# Patient Record
Sex: Female | Born: 1950 | Race: Black or African American | Hispanic: No | Marital: Married | Smoking: Former smoker
Health system: Southern US, Community
[De-identification: ages and names within clinical notes are randomized; demographics above are authoritative.]

## PROBLEM LIST (undated history)

## (undated) DIAGNOSIS — I10 Essential (primary) hypertension: Secondary | ICD-10-CM

## (undated) DIAGNOSIS — E039 Hypothyroidism, unspecified: Secondary | ICD-10-CM

## (undated) DIAGNOSIS — K219 Gastro-esophageal reflux disease without esophagitis: Secondary | ICD-10-CM

## (undated) DIAGNOSIS — E7800 Pure hypercholesterolemia, unspecified: Secondary | ICD-10-CM

## (undated) HISTORY — PX: THYROIDECTOMY: SHX17

## (undated) HISTORY — PX: CARDIAC CATHETERIZATION: SHX172

## (undated) HISTORY — PX: BILATERAL OOPHORECTOMY: SHX1221

## (undated) HISTORY — PX: ABDOMINAL HYSTERECTOMY: SHX81

---

## 2000-01-01 ENCOUNTER — Encounter: Payer: Self-pay | Admitting: Emergency Medicine

## 2000-01-01 ENCOUNTER — Inpatient Hospital Stay (HOSPITAL_COMMUNITY): Admission: EM | Admit: 2000-01-01 | Discharge: 2000-01-04 | Payer: Self-pay | Admitting: Emergency Medicine

## 2000-09-02 ENCOUNTER — Encounter: Payer: Self-pay | Admitting: Internal Medicine

## 2000-09-02 ENCOUNTER — Ambulatory Visit (HOSPITAL_COMMUNITY): Admission: RE | Admit: 2000-09-02 | Discharge: 2000-09-02 | Payer: Self-pay | Admitting: Internal Medicine

## 2000-12-21 ENCOUNTER — Ambulatory Visit (HOSPITAL_COMMUNITY): Admission: RE | Admit: 2000-12-21 | Discharge: 2000-12-21 | Payer: Self-pay | Admitting: General Surgery

## 2000-12-21 ENCOUNTER — Encounter (INDEPENDENT_AMBULATORY_CARE_PROVIDER_SITE_OTHER): Payer: Self-pay | Admitting: *Deleted

## 2000-12-21 ENCOUNTER — Encounter (HOSPITAL_BASED_OUTPATIENT_CLINIC_OR_DEPARTMENT_OTHER): Payer: Self-pay | Admitting: General Surgery

## 2001-01-21 ENCOUNTER — Encounter (HOSPITAL_BASED_OUTPATIENT_CLINIC_OR_DEPARTMENT_OTHER): Payer: Self-pay | Admitting: General Surgery

## 2001-01-25 ENCOUNTER — Ambulatory Visit (HOSPITAL_COMMUNITY): Admission: RE | Admit: 2001-01-25 | Discharge: 2001-01-26 | Payer: Self-pay | Admitting: General Surgery

## 2003-04-06 ENCOUNTER — Encounter: Payer: Self-pay | Admitting: Obstetrics

## 2003-04-06 ENCOUNTER — Encounter: Admission: RE | Admit: 2003-04-06 | Discharge: 2003-04-06 | Payer: Self-pay | Admitting: Obstetrics

## 2005-03-20 ENCOUNTER — Encounter: Admission: RE | Admit: 2005-03-20 | Discharge: 2005-03-20 | Payer: Self-pay | Admitting: Obstetrics

## 2005-07-28 ENCOUNTER — Encounter: Admission: RE | Admit: 2005-07-28 | Discharge: 2005-07-28 | Payer: Self-pay | Admitting: Cardiology

## 2008-07-03 ENCOUNTER — Encounter: Admission: RE | Admit: 2008-07-03 | Discharge: 2008-07-03 | Payer: Self-pay | Admitting: Obstetrics

## 2008-07-03 ENCOUNTER — Encounter: Admission: RE | Admit: 2008-07-03 | Discharge: 2008-07-03 | Payer: Self-pay | Admitting: Cardiology

## 2011-02-13 NOTE — Discharge Summary (Signed)
[place]. [facility]  Patient:    Brenda Rivera, Brenda Rivera                          MRN: [id number] Adm. Date:  [id number] Disc. Date: [id number] Attending:  Pola Corn Dictator:   Lyla Son. Achilles Dunk.                           Discharge Summary  DISCHARGE DIAGNOSES: 1. Non-Q wave myocardial infarction. 2. Hypothyroidism. 3. Esophageal reflux disease. 4. Tobacco disorder. 5. Anxiety state. 6. Angina.  CONSULTANT:  Mr. Rinaldo Cloud.  PROCEDURE:  Cardiac catheterization, 01/02/00.  HISTORY OF PRESENT ILLNESS:  This is a 61 year old female who presented initially to the emergency department with complaint of chest tightness.  The patient complained of retrosternal chest tightness and pressure which did not respond to Tagamet.  The pain gradually radiated under the left shoulder and was rated by he patient as an eight (on a scale of one to ten).  She received nitroglycerin with some relief.  Electrocardiogram showed normal sinus rhythm with poor R-wave progression in leads V1 through V3 and it was questioned as to whether or not this patient may have ad a non-Q wave MI.  The patient was subsequently admitted for further evaluation concerning this problem.  HOSPITAL COURSE:  The patient was placed on telemetry.  She was seen by pharmacy for heparin protocol.  She was treated with nitrates and was scheduled for cardiac catheterization.  On 01/02/00, cardiac catheterization revealed good systolic function.  The obtuse marginal #1 was a medium-sized vessel with myocardial infarction bridging in the midportion.  There were no significant lesions noted on cardiac catheterization.  DISPOSITION:  On 01/04/00, there were very few symptoms and it was the opinion hat the patient could be discharged home, having received maximum benefit from this  hospitalization.  DISCHARGE MEDICATIONS:  Toprol XL 25 mg one-half tablet daily, nitroglycerin patch 0.2  mg/hr, Xanax 0.5 mg b.i.d., aspirin one daily, Pepcid 20 mg twice daily and  Tiazac 120 mg daily.   DISCHARGE INSTRUCTIONS:  The patient is to follow-up with Dr. Sharyn Lull in one week, and with Dr. Shana Chute in two weeks.  She is to notify the physician immediately f any changes, problems or concerns. DD:  02/12/00 TD:  02/12/00 Job: [id number]

## 2011-02-13 NOTE — Op Note (Signed)
[place]. [facility]  Patient:    Brenda Rivera, Brenda Rivera                          MRN: [id number] Proc. Date: 01/25/01 Adm. Date:  [id number] Attending:  Sonda Primes CC:         Two copies to Dr. Lurene Shadow.  Lindell Spar. Chestine Spore, M.D.   Operative Report  PREOPERATIVE DIAGNOSIS:  Right thyroid goiter.  POSTOPERATIVE DIAGNOSIS:  Right thyroid goiter.  OPERATION:  Right thyroid lobectomy and isthmectomy with frozen section.  SURGEON:  Mardene Celeste. Lurene Shadow, M.D.  ASSISTANT:  Marnee Spring. Wiliam Ke, M.D.  ANESTHESIA:  General.  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  She had been on suppression therapy with Synthroid over a long period of time with good results; however, in the recent past, the thyroid has been enlarging rather rapidly.  She comes to the operating room now for thyroid lobectomy.  DESCRIPTION OF PROCEDURE:  Following the induction of satisfactory general anesthetic with the patient positioned supinely with the neck hyperextended, the anterior neck and chest were prepped and draped to be included in the sterile operative field.  A transverse collar incision made, deepened through the skin and subcutaneous tissue and carried down through the platysma. Superior flap raised up to the thyroid cartilage and the inferior flap down to the sternal notch.  Midline strap muscles were opened.  The midline was displaced to the left laterally due to the large thyroid nodule.  Strap muscles were opened and dissection carried down upon the thyroid.  Starting at the lower pole, the lower pole vessels were identified and transected and suture ligated with 2-0 silk sutures.  The lower pole parathyroid and the recurrent laryngeal nerve were both located and dissected free away from the thyroid.  Dissection was carried up along the thyroid up to the upper pole. The upper pole was doubly ligated with 0 silk sutures  and with a 2-0 stick tie.  The thyroid was then dissected free away from the trachea carrying the dissection across the isthmus to the left lobe.  The isthmus was transected and secured with ties of 2-0 silk.  All layers of dissection were then thoroughly checked for hemostasis.  Hemostasis was noted to be excellent. Sponge, instrument and sharp counts were verified and the neck was thoroughly irrigated.  Midline strap muscles were closed with a running suture of 3-0 Vicryl.  Platysma muscle and subcutaneous tissues were closed with interrupted 3-0 Vicryl sutures.  Skin was closed with a running 5-0 Monocryl and then reinforced with Steri-Strips.  Sterile dressing was applied.  Anesthetic was reversed.  The patient was removed from the operating room to the recovery room in stable condition.  She tolerated the procedure well. DD:  01/25/01 TD:  01/25/01 Job: [id number]

## 2011-02-13 NOTE — Cardiovascular Report (Signed)
[place]. [facility]  Patient:    Brenda Rivera, Brenda Rivera                          MRN: [id number] Proc. Date: 01/02/00 Adm. Date:  [id number] Attending:  Robynn Pane CC:         Osvaldo Shipper. Spruill, M.D.             Cardiac Catheterization Lab                        Cardiac Catheterization  PROCEDURES:  Left cardiac catheterization, selective left and right coronary angiography, left ventriculography via right groin using Judkins technique.  INDICATIONS FOR PROCEDURE:  Brenda Rivera is a 60 year old black female with a past medical history significant for hypothyroidism, GERD, tobacco abuse, anxiety disorder.  She came to the ER complaining of retrosternal chest tightness and pressure which started around 6:00 a.m.Marland Kitchen  She took Tagamet without relief.  She went to work and continued to have chest tightness and pressure radiating to under her left shoulder and finally came to the ER.  The pain was graded 6-8 out of 10, and she received two sublingual nitroglycerin with relief of chest tightness.  EKG done in the ER showed normal sinus rhythm with poor R wave progression, and V1-V3 had nonspecific T wave changes. CPK-MB was positive, and troponin-I was slightly elevated.  The patient presently is pain-free, denies PND, orthopnea, or leg swelling.  Denies palpitations, lightheadedness, or syncope, but felt dizzy and weak prior to coming to the hospital.  PAST MEDICAL HISTORY:  As above.  PAST SURGICAL HISTORY:  She had a D&C many years ago.  She had partial hysterectomy 16 years ago.  SOCIAL HISTORY:  She is married and has two children.  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  Born in [place], lives in [place].  FAMILY HISTORY:  Father died at the age of 53.  She does not know the cause. Mother had heart problems.  She is 69.  She has one brother and three sisters in good health.  PHYSICAL EXAMINATION:  On examination, she was  alert and oriented x 3 and in no acute distress.  Hemodynamically stable.  Sinus rhythm on monitor.  HEENT:  Conjunctiva was pink.  NECK:  Supple.  No JVD, no bruits.  LUNGS:  Clear to auscultation without rhonchi or rales.  CARDIOVASCULAR:  S1-S2 is normal.  There was no S3, gallop, or murmur.  ABDOMEN:  Soft.  Bowel sounds were present.  Nontender.  EXTREMITIES:  There was no clubbing, cyanosis, or edema.  LABORATORY:  CPK, first set, was 170 with an MB of 11.9 with ______  index of 7.0.  Second set CPK was 221, MB of 14.6 with ______ index of 6.6.  Her troponin, first set, was 0.12.  Second set was 0.332.  Third set was 0.54, so all troponins and CPKs were slightly elevated.  BRIEF HOSPITAL COURSE:  The patient was admitted to telemetry unit.  The patient ruled in for an MI due to positive troponins, CPKs and typical anginal chest pain.  Due to multiple risk factors and recent small non-Q-wave MI, the patient was advised for left catheterization and possible angioplasty to ______ .  PROCEDURE:  After obtaining the informed consent, the patient was brought to the catheterization lab and was placed on the fluoroscopy table.  The right  groin was prepped and draped in the usual fashion.  Xylocaine, 2%, was used for local anesthesia in the right groin.  With the help of a thin-walled needle, a 6-French arterial sheath was placed.  The sheath was aspirated and flushed.  Next, a 6-French left Judkins catheter was advanced over the wire under fluoroscopic guidance up to the ascending aorta.  The wire was pulled out.  The catheter was aspirated and connected to the manifold.  The catheter was further advanced and engaged into the left coronary ostium.  Multiple views of the left system were taken.  Next, the catheter was disengaged and was pulled out over the wire and was replaced with a 6-French right Judkins catheter which was advanced over the wire under fluoroscopic guidance up  to the ascending aorta.  The wire was pulled out.  The catheter was aspirated and connected to the manifold.  The catheter was further advanced and engaged into the right coronary ostium.  Multiple views of the right system were taken. Next, the catheter was disengaged and was pulled out over the wire and was replaced with a 6-French pigtail catheter which was advanced over the wire under fluoroscopic guidance up to the ascending aorta.  The wire was pulled out.  The catheter was aspirated and connected to the manifold.  The catheter was further advanced across the aortic valve into the LV.  LV pressures were recorded.  Next, a left ventriculogram was done in a 30-degree RAO position. Post angiographic pressures were recorded from the LV, and then pullback pressures were recorded from the aorta.  There was no gradient across the aortic valve.  Next, the pigtail catheter was pulled out over the wire.  The sheaths were aspirated and flushed.  The arteriotomy was closed with Perclose without any complications.  The patient tolerated the procedure well.  There were no complications.  FINDINGS:  LV:  The patient has good LV systolic function.  EF 50-55%.  1. The left main was patent. 2. The LAD was patent.  Diagonal #1 was medium sized with myocardial    bridging in the mid portion.  The vessel size is approximately 1.5 mm    and appears hazy focally in the mid portion during systole.  There    is no significant lesion in diastole and it was up appropriately in    diastole in the mid portion.  Diagonal #2 is very, very small.    Diagonal #3 and diagonal #4 are also very small, less than 1-mm size,    which are patent. 3. The left circumflex is small which tapers down in the AV groove.  The    OM-1 is medium sized which is patent. 4. The RCA has minimal plaquing distally.  The patient tolerated the procedure well.  PLAN:  Discontinue heparin.  Discontinue IV nitrates.  Change to  Nitro-Dur. Will continue with beta blockers due to myocardial bridging.  Will monitor  CPKs and troponin in a.m.  The patient will be discharged home per Dr. Shana Chute once she is stable, and her troponins and CPKs are negative. DD:  01/02/00 TD:  01/02/00 Job: [id number]

## 2012-02-15 ENCOUNTER — Other Ambulatory Visit: Payer: Self-pay | Admitting: Cardiology

## 2012-02-29 ENCOUNTER — Other Ambulatory Visit: Payer: Self-pay | Admitting: Cardiology

## 2012-05-12 ENCOUNTER — Other Ambulatory Visit: Payer: Self-pay | Admitting: Obstetrics

## 2012-05-12 DIAGNOSIS — Z1231 Encounter for screening mammogram for malignant neoplasm of breast: Secondary | ICD-10-CM

## 2012-05-17 ENCOUNTER — Ambulatory Visit
Admission: RE | Admit: 2012-05-17 | Discharge: 2012-05-17 | Disposition: A | Discharge status: Home / Self Care | Payer: 59 | Source: Ambulatory Visit | Attending: Obstetrics | Admitting: Obstetrics

## 2012-05-17 DIAGNOSIS — Z1231 Encounter for screening mammogram for malignant neoplasm of breast: Secondary | ICD-10-CM

## 2012-05-18 ENCOUNTER — Other Ambulatory Visit (HOSPITAL_COMMUNITY): Payer: Self-pay | Admitting: Obstetrics

## 2012-05-18 DIAGNOSIS — R1900 Intra-abdominal and pelvic swelling, mass and lump, unspecified site: Secondary | ICD-10-CM

## 2012-05-20 ENCOUNTER — Ambulatory Visit (HOSPITAL_COMMUNITY)
Admission: RE | Admit: 2012-05-20 | Discharge: 2012-05-20 | Disposition: A | Discharge status: Home / Self Care | Payer: 59 | Source: Ambulatory Visit | Attending: Obstetrics | Admitting: Obstetrics

## 2012-05-20 DIAGNOSIS — Z90710 Acquired absence of both cervix and uterus: Secondary | ICD-10-CM | POA: Insufficient documentation

## 2012-05-20 DIAGNOSIS — R1900 Intra-abdominal and pelvic swelling, mass and lump, unspecified site: Secondary | ICD-10-CM

## 2012-05-20 DIAGNOSIS — N9489 Other specified conditions associated with female genital organs and menstrual cycle: Secondary | ICD-10-CM | POA: Insufficient documentation

## 2012-05-24 ENCOUNTER — Ambulatory Visit (HOSPITAL_COMMUNITY)
Admission: RE | Admit: 2012-05-24 | Discharge: 2012-05-24 | Disposition: A | Discharge status: Home / Self Care | Payer: 59 | Source: Ambulatory Visit | Attending: Obstetrics | Admitting: Obstetrics

## 2012-05-24 ENCOUNTER — Other Ambulatory Visit (HOSPITAL_COMMUNITY): Payer: Self-pay | Admitting: Obstetrics

## 2012-05-24 DIAGNOSIS — R1909 Other intra-abdominal and pelvic swelling, mass and lump: Secondary | ICD-10-CM | POA: Insufficient documentation

## 2012-05-24 DIAGNOSIS — R1900 Intra-abdominal and pelvic swelling, mass and lump, unspecified site: Secondary | ICD-10-CM

## 2012-05-24 DIAGNOSIS — Z90710 Acquired absence of both cervix and uterus: Secondary | ICD-10-CM | POA: Insufficient documentation

## 2012-05-24 DIAGNOSIS — G589 Mononeuropathy, unspecified: Secondary | ICD-10-CM | POA: Insufficient documentation

## 2012-05-24 LAB — CREATININE, SERUM
Creatinine, Ser: 1.14 mg/dL — ABNORMAL HIGH (ref 0.50–1.10)
GFR calc Af Amer: 58 mL/min — ABNORMAL LOW (ref >90)
GFR calc non Af Amer: 50 mL/min — ABNORMAL LOW (ref >90)

## 2012-05-24 MED ORDER — GADOBENATE DIMEGLUMINE 529 MG/ML IV SOLN
14.0000 mL | Freq: Once | INTRAVENOUS | Status: AC | PRN
  Administered 2012-05-24: 14 mL via INTRAVENOUS

## 2012-10-19 ENCOUNTER — Other Ambulatory Visit (HOSPITAL_COMMUNITY): Payer: Self-pay | Admitting: Obstetrics

## 2012-10-19 DIAGNOSIS — R1900 Intra-abdominal and pelvic swelling, mass and lump, unspecified site: Secondary | ICD-10-CM

## 2012-10-20 ENCOUNTER — Ambulatory Visit (HOSPITAL_COMMUNITY)
Admission: RE | Admit: 2012-10-20 | Discharge: 2012-10-20 | Disposition: A | Discharge status: Home / Self Care | Payer: BC Managed Care – PPO | Source: Ambulatory Visit | Attending: Obstetrics | Admitting: Obstetrics

## 2012-10-20 DIAGNOSIS — R1900 Intra-abdominal and pelvic swelling, mass and lump, unspecified site: Secondary | ICD-10-CM | POA: Insufficient documentation

## 2012-10-20 DIAGNOSIS — G589 Mononeuropathy, unspecified: Secondary | ICD-10-CM | POA: Insufficient documentation

## 2012-10-20 LAB — CREATININE, SERUM
Creatinine, Ser: 0.95 mg/dL (ref 0.50–1.10)
GFR calc Af Amer: 72 mL/min — ABNORMAL LOW (ref >90)

## 2012-10-20 MED ORDER — GADOBENATE DIMEGLUMINE 529 MG/ML IV SOLN
14.0000 mL | Freq: Once | INTRAVENOUS | Status: AC | PRN
  Administered 2012-10-20: 14 mL via INTRAVENOUS

## 2012-11-10 ENCOUNTER — Ambulatory Visit: Payer: 59 | Admitting: Gynecologic Oncology

## 2012-12-01 ENCOUNTER — Telehealth: Payer: Self-pay | Admitting: Gynecologic Oncology

## 2012-12-01 ENCOUNTER — Ambulatory Visit: Payer: 59 | Admitting: Gynecologic Oncology

## 2012-12-01 NOTE — Telephone Encounter (Signed)
Dr. Elsie Stain office, referring physician, notified that the patient cancelled her new patient appt today with Dr. Nelly Rout.  Pt stated that she did not have insurance.

## 2013-02-17 ENCOUNTER — Telehealth: Payer: Self-pay | Admitting: Oncology

## 2013-02-17 NOTE — Telephone Encounter (Signed)
CALLED REFERRING OFFICE INQUIRING ABOUT REC'S PER OFFICE DR. WILL FAX OVER REC;S

## 2013-03-20 ENCOUNTER — Telehealth: Payer: Self-pay | Admitting: Gynecologic Oncology

## 2013-03-20 NOTE — Telephone Encounter (Signed)
Consult Note: Gyn-Onc  Consult was requested by Dr. Marland Kitchen for the evaluation of Brenda Rivera 62 y.o. female  CC:    Assessment/Plan:  Ms. JOSSLIN SANJUAN  is a 62 y.o.  year old **   HPI: **  Pelvic UTZ 04/2012  There is a cystic midline pelvic oval mass with homogeneous internal echoes measuring overall 12.9 x 10.8 x 7.5 cm.  Uterus and ovaries not visualized. Large midline cystic mass with imaging features suggestive of endometrioma in the appropriate clinical context. Otherwise, inclusion cyst, duplication cyst. lymphangioma, or less likely cystic neoplasm could have a similar appearance   Current Meds:  No outpatient encounter prescriptions on file as of 03/20/2013.   No facility-administered encounter medications on file as of 03/20/2013.    Allergy: Allergies not on file  Social Hx:   History   Social History  . Marital Status: Married    Spouse Name: N/A    Number of Children: N/A  . Years of Education: N/A   Occupational History  . Not on file.   Social History Main Topics  . Smoking status: Not on file  . Smokeless tobacco: Not on file  . Alcohol Use: Not on file  . Drug Use: Not on file  . Sexually Active: Not on file   Other Topics Concern  . Not on file   Social History Narrative  . No narrative on file    Past Surgical Hx: No past surgical history on file.  Past Medical Hx: No past medical history on file.  Past Gynecological History:  ** Hysterectomy   Family Hx: No family history on file.  Review of Systems:  Constitutional  Feels well,  ** Cardiovascular  No chest pain, shortness of breath, or edema  Pulmonary  No cough or wheeze.  Gastro Intestinal  No nausea, vomitting, or diarrhoea. No bright red blood per rectum, no abdominal pain, change in bowel movement, or constipation.  Genito Urinary  No frequency, urgency, dysuria, ** Musculo Skeletal  No myalgia, arthralgia, joint swelling or pain  Neurologic  No weakness, numbness, change  in gait,  Psychology  No depression, anxiety, insomnia.   Vitals:  @V @  Physical Exam: WD in NAD Neck  Supple NROM, without any enlargements.  Lymph Node Survey No cervical supraclavicular or inguinal adenopathy Cardiovascular  Pulse normal rate, regularity and rhythm. S1 and S2 normal.  Lungs  Clear to auscultation bilateraly, without wheezes/crackles/rhonchi. Good air movement.  Skin  No rash/lesions/breakdown  Psychiatry  Alert and oriented to person, place, and time  Abdomen  Normoactive bowel sounds, abdomen soft, non-tender and obese. Surgical  sites intact without evidence of hernia.  Back No CVA tenderness Genito Urinary  Vulva/vagina: Normal external female genitalia.  No lesions. No discharge or bleeding.  Bladder/urethra:  No lesions or masses  Vagina: **   Adnexa: No palpable masses. Rectal  Good tone, no masses no cul de sac nodularity.  Extremities  No bilateral cyanosis, clubbing or edema.   Laurette Schimke, MD, PhD 03/20/2013, 5:01 PM

## 2013-03-21 ENCOUNTER — Ambulatory Visit: Payer: PRIVATE HEALTH INSURANCE | Attending: Gynecologic Oncology | Admitting: Gynecologic Oncology

## 2013-03-21 ENCOUNTER — Encounter: Payer: Self-pay | Admitting: Gynecologic Oncology

## 2013-03-21 VITALS — BP 142/76 | HR 68 | Temp 98.0°F | Resp 16 | Ht 63.35 in | Wt 153.3 lb

## 2013-03-21 DIAGNOSIS — R1900 Intra-abdominal and pelvic swelling, mass and lump, unspecified site: Secondary | ICD-10-CM | POA: Insufficient documentation

## 2013-03-21 DIAGNOSIS — Z90710 Acquired absence of both cervix and uterus: Secondary | ICD-10-CM | POA: Insufficient documentation

## 2013-03-21 DIAGNOSIS — R1909 Other intra-abdominal and pelvic swelling, mass and lump: Secondary | ICD-10-CM | POA: Insufficient documentation

## 2013-03-21 NOTE — Progress Notes (Signed)
Consult Note: Gyn-Onc  Consult was requested by Dr. Gaynell Face for the evaluation of Brenda Rivera 62 y.o. female  CC: Pelvic mass   Assessment/Plan:  Brenda Rivera  is a 62 y.o.  year old with a 40 cm pelvic mass. CA 125 less than a year ago within normal limits. A CA 125 will be repeated today. The recommendation presented to her was that of exploratory laparotomy bilateral salpingo-oophorectomy with staging and debulking as indicated by a frozen pathology findings. Risks of procedure discussed with the patient her inclusive of infection bleeding damage to the structures a prolonged hospitalization. Patient is aware that her surgeon will be Dr. De Blanch of the procedure will take place on 04/18/2013. All of her questions were answered to her satisfaction   HPI: Brenda Rivera a 62 year old nulliparous female who is been aware of the presence of a pelvic mass over 1 year.  Patient has not been compliant with prior appointments because of insurance issues. Pelvic UTZ 04/2012  There is a cystic midline pelvic oval mass with homogeneous internal echoes measuring overall 12.9 x 10.8 x 7.5 cm. Uterus and ovaries not visualized. Large midline cystic mass with imaging features suggestive of endometrioma in the appropriate clinical context. Otherwise, inclusion cyst, duplication cyst. lymphangioma, or less likely cystic neoplasm could have a similar appearance Ca 125 04/2012 10.9.  Patient denies abdominal bloating, reports pressure.  No changes in bowel or bladder habits.  No nausea vomiting, no change in appetite no early satiety.   Current Meds:  Outpatient Encounter Prescriptions as of 03/21/2013  Medication Sig Dispense Refill  . aspirin 81 MG tablet Take 81 mg by mouth daily.      Marland Kitchen atorvastatin (LIPITOR) 20 MG tablet Take 20 mg by mouth daily.      . Cholecalciferol (VITAMIN D) 1000 UNITS capsule Take 1,000 Units by mouth daily.      Marland Kitchen diltiazem (TAZTIA XT) 120 MG 24 hr capsule Take 120 mg  by mouth daily.      . famotidine (PEPCID) 20 MG tablet Take 20 mg by mouth daily. One each morning before breakfast      . levothyroxine (SYNTHROID, LEVOTHROID) 50 MCG tablet Take 50 mcg by mouth daily before breakfast.       No facility-administered encounter medications on file as of 03/21/2013.    Allergy:  Allergies  Allergen Reactions  . Vibramycin (Doxycycline) Rash    Social Hx:   History   Social History  . Marital Status: Married    Spouse Name: N/A    Number of Children: N/A  . Years of Education: N/A   Occupational History  . Not on file.   Social History Main Topics  . Smoking status: Former Smoker    Quit date: 03/01/1989  . Smokeless tobacco: Not on file     Comment: pt unsure of exact quit date but it has been 53yrs since she quit.  . Alcohol Use: No  . Drug Use: No  . Sexually Active: No   Other Topics Concern  . Not on file   Social History Narrative  . No narrative on file    Past Surgical ZO:[id number]  Past Medical Hx: No past medical history on file.  Past Gynecological History:  G0 Menarche 12 regular menses until hysterectomy  for leiomyoma.  Family Hx: No family history on file.  Review of Systems:  Constitutional  Feels well,  Denies weight loss or malaise. Cardiovascular  No chest pain, shortness of  breath, or edema  Pulmonary  No cough or wheeze.  Gastro Intestinal  No nausea, vomitting, or diarrhoea. No bright red blood per rectum, no abdominal pain, change in bowel movement, or constipation. No abdominal bloating or early satiety Genito Urinary  No frequency, urgency, dysuria,no vaginal bleeding or discharge Musculo Skeletal  No myalgia, arthralgia, joint swelling or pain  Neurologic  No weakness, numbness, change in gait,  Psychology  No depression, anxiety, insomnia.   Vitals: BP 142/76  Pulse 68  Temp(Src) 98 F (36.7 C) (Oral)  Resp 16  Ht 5' 3.35" (1.609 m)  Wt 153 lb 4.8 oz (69.536 kg)  BMI 26.86  kg/m2  Physical Exam: WD in NAD Neck  Supple NROM, without any enlargements.  Lymph Node Survey No cervical supraclavicular or inguinal adenopathy Cardiovascular  Pulse normal rate, regularity and rhythm. S1 and S2 normal.  Lungs  Clear to auscultation bilateraly, without wheezes/crackles/rhonchi. Good air movement.  Psychiatry  Alert and oriented to person, place, and time  Abdomen  Normoactive bowel sounds, abdomen soft, non-tender and obese. Midline surgical  sites intact without evidence of hernia. Mass palpable Back No CVA tenderness Genito Urinary  Vulva/vagina: Normal external female genitalia.  No lesions. No discharge or bleeding.  Bladder/urethra:  No lesions or masses  Vagina:very atrophic, no discharge or bleeding  Adnexa: Firm 14cm pelvic mass.  No cul de sac nodularity  Rectal  Good tone, no masses no cul de sac nodularity. Mass palpable. Extremities  No bilateral cyanosis, clubbing or edema.   Laurette Schimke, MD, PhD 03/21/2013, 11:34 AM

## 2013-03-21 NOTE — Patient Instructions (Signed)
Exploratory laparotomy bilateral salpingo-oophorectomy and possible staging and debulking scheduled with Dr. De Blanch on 04/18/2013. Please present for your preoperative evaluation this is scheduled.   Thank you very much Brenda Rivera for allowing me to provide care for you today.  I appreciate your confidence in choosing our Gynecologic Oncology team.  If you have any questions about your visit today please call our office and we will get back to you as soon as possible.  [name]. Bretta Fees MD., PhD Gynecologic Oncology

## 2013-04-05 ENCOUNTER — Encounter (HOSPITAL_COMMUNITY): Payer: Self-pay | Admitting: Pharmacy Technician

## 2013-04-06 ENCOUNTER — Other Ambulatory Visit (HOSPITAL_COMMUNITY): Payer: Self-pay | Admitting: *Deleted

## 2013-04-07 ENCOUNTER — Encounter (HOSPITAL_COMMUNITY): Payer: Self-pay

## 2013-04-07 ENCOUNTER — Ambulatory Visit (HOSPITAL_COMMUNITY)
Admission: RE | Admit: 2013-04-07 | Discharge: 2013-04-07 | Disposition: A | Discharge status: Home / Self Care | Payer: PRIVATE HEALTH INSURANCE | Source: Ambulatory Visit | Attending: Gynecology | Admitting: Gynecology

## 2013-04-07 ENCOUNTER — Encounter (HOSPITAL_COMMUNITY)
Admission: RE | Admit: 2013-04-07 | Discharge: 2013-04-07 | Disposition: A | Discharge status: Home / Self Care | Payer: PRIVATE HEALTH INSURANCE | Source: Ambulatory Visit | Attending: Gynecology | Admitting: Gynecology

## 2013-04-07 DIAGNOSIS — Z01818 Encounter for other preprocedural examination: Secondary | ICD-10-CM | POA: Insufficient documentation

## 2013-04-07 DIAGNOSIS — R1900 Intra-abdominal and pelvic swelling, mass and lump, unspecified site: Secondary | ICD-10-CM | POA: Insufficient documentation

## 2013-04-07 HISTORY — DX: Pure hypercholesterolemia, unspecified: E78.00

## 2013-04-07 HISTORY — DX: Gastro-esophageal reflux disease without esophagitis: K21.9

## 2013-04-07 HISTORY — DX: Essential (primary) hypertension: I10

## 2013-04-07 HISTORY — DX: Hypothyroidism, unspecified: E03.9

## 2013-04-07 LAB — CBC WITH DIFFERENTIAL/PLATELET
Eosinophils Absolute: 0.1 10*3/uL (ref 0.0–0.7)
Eosinophils Relative: 2 % (ref 0–5)
HCT: 40.2 % (ref 36.0–46.0)
Hemoglobin: 13 g/dL (ref 12.0–15.0)
Lymphocytes Relative: 33 % (ref 12–46)
Lymphs Abs: 2 10*3/uL (ref 0.7–4.0)
MCH: 27 pg (ref 26.0–34.0)
MCV: 83.4 fL (ref 78.0–100.0)
Monocytes Relative: 7 % (ref 3–12)
RBC: 4.82 MIL/uL (ref 3.87–5.11)
WBC: 6.2 10*3/uL (ref 4.0–10.5)

## 2013-04-07 LAB — COMPREHENSIVE METABOLIC PANEL
ALT: 17 U/L (ref 0–35)
Alkaline Phosphatase: 100 U/L (ref 39–117)
BUN: 16 mg/dL (ref 6–23)
CO2: 30 mEq/L (ref 19–32)
Calcium: 10 mg/dL (ref 8.4–10.5)
GFR calc Af Amer: 71 mL/min — ABNORMAL LOW (ref >90)
GFR calc non Af Amer: 61 mL/min — ABNORMAL LOW (ref >90)
Glucose, Bld: 121 mg/dL — ABNORMAL HIGH (ref 70–99)
Total Protein: 7.8 g/dL (ref 6.0–8.3)

## 2013-04-07 NOTE — Progress Notes (Signed)
EKG 02/14/13 on chart

## 2013-04-07 NOTE — Patient Instructions (Addendum)
20 Brenda Rivera  04/07/2013   Your procedure is scheduled on: 04/18/13  Report to [facility] at 5:15 AM.  Call this number if you have problems the morning of surgery [phone]   Remember: follow bowel prep instuctions   Do not eat food or drink liquids After Midnight.     Take these medicines the morning of surgery with A SIP OF WATER: pepcid, diltiazem, levothyroxine, lipitor   Do not wear jewelry, make-up or nail polish.  Do not wear lotions, powders, or perfumes. You may wear deodorant.  Do not shave 48 hours prior to surgery. Men may shave face and neck.  Do not bring valuables to the hospital.  Contacts, dentures or bridgework may not be worn into surgery.  Leave suitcase in the car. After surgery it may be brought to your room.  For patients admitted to the hospital, checkout time is 11:00 AM the day of discharge.    Please read over the following fact sheets that you were given: MRSA Information, incentive spirometry fact sheet, clear liquids fact sheet  Birdie Sons, RN  pre op nurse call if needed [phone]    FAILURE TO FOLLOW THESE INSTRUCTIONS MAY RESULT IN CANCELLATION OF YOUR SURGERY   Patient Signature: ___________________________________________

## 2013-04-08 LAB — CA 125: CA 125: 7.3 U/mL (ref 0.0–30.2)

## 2013-04-17 ENCOUNTER — Telehealth: Payer: Self-pay | Admitting: Gynecologic Oncology

## 2013-04-17 NOTE — Telephone Encounter (Signed)
Spoke with patient about pre-operative status.  She has started taking in clear liquids only this am.  Plans to begin bowel prep in at 10 am today.  No concerns voiced.  Instructed to call for any needs.  Verbalizing understanding of instructions for surgery tomorrow morning.  

## 2013-04-17 NOTE — Anesthesia Preprocedure Evaluation (Addendum)
Anesthesia Evaluation  Patient identified by MRN, date of birth, ID band Patient awake    Reviewed: Allergy & Precautions, H&P , NPO status , Patient's Chart, lab work & pertinent test results  Airway Mallampati: II TM Distance: >3 FB Neck ROM: full    Dental  (+) Edentulous Upper, Missing and Dental Advisory Given Missing all but 2 teeth lower:   Pulmonary neg pulmonary ROS,  breath sounds clear to auscultation  Pulmonary exam normal       Cardiovascular Exercise Tolerance: Good hypertension, Pt. on medications Rhythm:regular Rate:Normal     Neuro/Psych negative neurological ROS  negative psych ROS   GI/Hepatic negative GI ROS, Neg liver ROS, GERD-  Medicated and Controlled,  Endo/Other  negative endocrine ROSHypothyroidism   Renal/GU negative Renal ROS  negative genitourinary   Musculoskeletal   Abdominal   Peds  Hematology negative hematology ROS (+)   Anesthesia Other Findings   Reproductive/Obstetrics negative OB ROS                          Anesthesia Physical Anesthesia Plan  ASA: III  Anesthesia Plan: General   Post-op Pain Management:    Induction: Intravenous  Airway Management Planned: Oral ETT  Additional Equipment:   Intra-op Plan:   Post-operative Plan: Extubation in OR  Informed Consent: I have reviewed the patients History and Physical, chart, labs and discussed the procedure including the risks, benefits and alternatives for the proposed anesthesia with the patient or authorized representative who has indicated his/her understanding and acceptance.   Dental Advisory Given  Plan Discussed with: CRNA and Surgeon  Anesthesia Plan Comments:         Anesthesia Quick Evaluation

## 2013-04-18 ENCOUNTER — Encounter (HOSPITAL_COMMUNITY)
Admission: RE | Disposition: A | Discharge status: Home / Self Care | Payer: Self-pay | Source: Ambulatory Visit | Attending: Obstetrics & Gynecology

## 2013-04-18 ENCOUNTER — Encounter (HOSPITAL_COMMUNITY): Payer: Self-pay | Admitting: Anesthesiology

## 2013-04-18 ENCOUNTER — Encounter (HOSPITAL_COMMUNITY): Payer: Self-pay | Admitting: *Deleted

## 2013-04-18 ENCOUNTER — Ambulatory Visit (HOSPITAL_COMMUNITY): Payer: No Typology Code available for payment source | Admitting: Anesthesiology

## 2013-04-18 ENCOUNTER — Inpatient Hospital Stay (HOSPITAL_COMMUNITY)
Admission: RE | Admit: 2013-04-18 | Discharge: 2013-04-21 | DRG: 742 | Disposition: A | Discharge status: Home / Self Care | Payer: No Typology Code available for payment source | Source: Ambulatory Visit | Attending: Obstetrics & Gynecology | Admitting: Obstetrics & Gynecology

## 2013-04-18 DIAGNOSIS — R1900 Intra-abdominal and pelvic swelling, mass and lump, unspecified site: Secondary | ICD-10-CM

## 2013-04-18 DIAGNOSIS — N83209 Unspecified ovarian cyst, unspecified side: Secondary | ICD-10-CM

## 2013-04-18 DIAGNOSIS — N135 Crossing vessel and stricture of ureter without hydronephrosis: Secondary | ICD-10-CM | POA: Diagnosis present

## 2013-04-18 DIAGNOSIS — D279 Benign neoplasm of unspecified ovary: Principal | ICD-10-CM | POA: Diagnosis present

## 2013-04-18 DIAGNOSIS — I10 Essential (primary) hypertension: Secondary | ICD-10-CM | POA: Diagnosis present

## 2013-04-18 HISTORY — PX: LAPAROTOMY: SHX154

## 2013-04-18 LAB — TYPE AND SCREEN

## 2013-04-18 SURGERY — LAPAROTOMY, EXPLORATORY
Anesthesia: General | Laterality: Bilateral | Wound class: Clean

## 2013-04-18 MED ORDER — NEOSTIGMINE METHYLSULFATE 1 MG/ML IJ SOLN
INTRAMUSCULAR | Status: DC | PRN
  Administered 2013-04-18: 5 mg via INTRAVENOUS

## 2013-04-18 MED ORDER — ONDANSETRON HCL 4 MG/2ML IJ SOLN: 4.0000 mg | Freq: Four times a day (QID) | INTRAMUSCULAR | Status: DC | PRN

## 2013-04-18 MED ORDER — KETOROLAC TROMETHAMINE 30 MG/ML IJ SOLN
15.0000 mg | Freq: Four times a day (QID) | INTRAMUSCULAR | Status: DC
  Administered 2013-04-18 – 2013-04-19 (×4): 15 mg via INTRAVENOUS
  Filled 2013-04-18 (×6): qty 1

## 2013-04-18 MED ORDER — KCL IN DEXTROSE-NACL 20-5-0.45 MEQ/L-%-% IV SOLN
INTRAVENOUS | Status: DC
  Administered 2013-04-18 – 2013-04-19 (×2): via INTRAVENOUS
  Filled 2013-04-18 (×2): qty 1000

## 2013-04-18 MED ORDER — LACTATED RINGERS IV SOLN: INTRAVENOUS | Status: DC

## 2013-04-18 MED ORDER — FAMOTIDINE 20 MG PO TABS
20.0000 mg | ORAL_TABLET | Freq: Every morning | ORAL | Status: DC
  Administered 2013-04-19 – 2013-04-21 (×3): 20 mg via ORAL
  Filled 2013-04-18 (×3): qty 1

## 2013-04-18 MED ORDER — FENTANYL CITRATE 0.05 MG/ML IJ SOLN
INTRAMUSCULAR | Status: DC | PRN
  Administered 2013-04-18 (×5): 50 ug via INTRAVENOUS

## 2013-04-18 MED ORDER — LACTATED RINGERS IV SOLN
INTRAVENOUS | Status: DC | PRN
  Administered 2013-04-18 (×2): via INTRAVENOUS

## 2013-04-18 MED ORDER — MAGNESIUM HYDROXIDE 400 MG/5ML PO SUSP
30.0000 mL | Freq: Three times a day (TID) | ORAL | Status: AC
  Administered 2013-04-18 – 2013-04-19 (×3): 30 mL via ORAL
  Filled 2013-04-18 (×3): qty 30

## 2013-04-18 MED ORDER — 0.9 % SODIUM CHLORIDE (POUR BTL) OPTIME
TOPICAL | Status: DC | PRN
  Administered 2013-04-18: 2000 mL

## 2013-04-18 MED ORDER — HEPARIN SODIUM (PORCINE) 1000 UNIT/ML IJ SOLN
INTRAMUSCULAR | Status: DC | PRN
  Administered 2013-04-18: 1000 [IU]

## 2013-04-18 MED ORDER — OXYCODONE HCL 5 MG PO TABS
5.0000 mg | ORAL_TABLET | ORAL | Status: DC | PRN
Start: 2013-04-18 — End: 2013-04-21

## 2013-04-18 MED ORDER — SUCCINYLCHOLINE CHLORIDE 20 MG/ML IJ SOLN
INTRAMUSCULAR | Status: DC | PRN
  Administered 2013-04-18: 100 mg via INTRAVENOUS

## 2013-04-18 MED ORDER — ATORVASTATIN CALCIUM 20 MG PO TABS
20.0000 mg | ORAL_TABLET | Freq: Every evening | ORAL | Status: DC
  Administered 2013-04-18 – 2013-04-20 (×3): 20 mg via ORAL
  Filled 2013-04-18 (×4): qty 1

## 2013-04-18 MED ORDER — HYDROMORPHONE HCL PF 1 MG/ML IJ SOLN
INTRAMUSCULAR | Status: AC
  Filled 2013-04-18: qty 1

## 2013-04-18 MED ORDER — LIDOCAINE HCL (CARDIAC) 20 MG/ML IV SOLN
INTRAVENOUS | Status: DC | PRN
  Administered 2013-04-18: 90 mg via INTRAVENOUS

## 2013-04-18 MED ORDER — HYDROMORPHONE HCL PF 1 MG/ML IJ SOLN
0.2500 mg | INTRAMUSCULAR | Status: DC | PRN
  Administered 2013-04-18 (×2): 0.5 mg via INTRAVENOUS

## 2013-04-18 MED ORDER — GLYCOPYRROLATE 0.2 MG/ML IJ SOLN
INTRAMUSCULAR | Status: DC | PRN
  Administered 2013-04-18: .6 mg via INTRAVENOUS

## 2013-04-18 MED ORDER — LEVOTHYROXINE SODIUM 50 MCG PO TABS
50.0000 ug | ORAL_TABLET | Freq: Every day | ORAL | Status: DC
  Filled 2013-04-18 (×3): qty 1

## 2013-04-18 MED ORDER — EPHEDRINE SULFATE 50 MG/ML IJ SOLN
INTRAMUSCULAR | Status: DC | PRN
  Administered 2013-04-18: 10 mg via INTRAVENOUS

## 2013-04-18 MED ORDER — KETOROLAC TROMETHAMINE 30 MG/ML IJ SOLN
15.0000 mg | Freq: Four times a day (QID) | INTRAMUSCULAR | Status: DC
  Filled 2013-04-18 (×2): qty 1

## 2013-04-18 MED ORDER — PROPOFOL 10 MG/ML IV BOLUS
INTRAVENOUS | Status: DC | PRN
  Administered 2013-04-18: 150 mg via INTRAVENOUS

## 2013-04-18 MED ORDER — DILTIAZEM HCL ER BEADS 120 MG PO CP24
120.0000 mg | ORAL_CAPSULE | Freq: Every day | ORAL | Status: DC
  Administered 2013-04-19 – 2013-04-21 (×3): 120 mg via ORAL
  Filled 2013-04-18 (×4): qty 1

## 2013-04-18 MED ORDER — SODIUM CHLORIDE 0.9 % IJ SOLN
INTRAMUSCULAR | Status: DC | PRN
  Administered 2013-04-18: 08:00:00

## 2013-04-18 MED ORDER — ONDANSETRON HCL 4 MG PO TABS: 4.0000 mg | ORAL_TABLET | Freq: Four times a day (QID) | ORAL | Status: DC | PRN

## 2013-04-18 MED ORDER — ACETAMINOPHEN 500 MG PO TABS
1000.0000 mg | ORAL_TABLET | Freq: Four times a day (QID) | ORAL | Status: DC
  Administered 2013-04-18 – 2013-04-19 (×4): 1000 mg via ORAL
  Filled 2013-04-18 (×8): qty 2

## 2013-04-18 MED ORDER — ENSURE COMPLETE PO LIQD: 237.0000 mL | Freq: Two times a day (BID) | ORAL | Status: DC

## 2013-04-18 MED ORDER — CEFAZOLIN SODIUM-DEXTROSE 2-3 GM-% IV SOLR
INTRAVENOUS | Status: AC
  Filled 2013-04-18: qty 50

## 2013-04-18 MED ORDER — CISATRACURIUM BESYLATE (PF) 10 MG/5ML IV SOLN
INTRAVENOUS | Status: DC | PRN
  Administered 2013-04-18: 10 mg via INTRAVENOUS

## 2013-04-18 MED ORDER — ONDANSETRON HCL 4 MG/2ML IJ SOLN
INTRAMUSCULAR | Status: DC | PRN
  Administered 2013-04-18: 4 mg via INTRAVENOUS

## 2013-04-18 MED ORDER — CEFAZOLIN SODIUM-DEXTROSE 2-3 GM-% IV SOLR
2.0000 g | INTRAVENOUS | Status: AC
  Administered 2013-04-18: 2 g via INTRAVENOUS

## 2013-04-18 MED ORDER — MIDAZOLAM HCL 5 MG/5ML IJ SOLN
INTRAMUSCULAR | Status: DC | PRN
  Administered 2013-04-18: 2 mg via INTRAVENOUS

## 2013-04-18 MED ORDER — ZOLPIDEM TARTRATE 5 MG PO TABS: 5.0000 mg | ORAL_TABLET | Freq: Every evening | ORAL | Status: DC | PRN

## 2013-04-18 MED ORDER — BUPIVACAINE LIPOSOME 1.3 % IJ SUSP
20.0000 mL | Freq: Once | INTRAMUSCULAR | Status: DC
  Filled 2013-04-18: qty 20

## 2013-04-18 MED ORDER — HEPARIN SODIUM (PORCINE) 1000 UNIT/ML IJ SOLN
INTRAMUSCULAR | Status: AC
  Filled 2013-04-18: qty 1

## 2013-04-18 MED ORDER — HYDROMORPHONE HCL PF 1 MG/ML IJ SOLN: 0.5000 mg | INTRAMUSCULAR | Status: DC | PRN

## 2013-04-18 MED ORDER — ASPIRIN EC 81 MG PO TBEC
81.0000 mg | DELAYED_RELEASE_TABLET | Freq: Every morning | ORAL | Status: DC
  Administered 2013-04-18 – 2013-04-21 (×4): 81 mg via ORAL
  Filled 2013-04-18 (×4): qty 1

## 2013-04-18 SURGICAL SUPPLY — 43 items
ATTRACTOMAT 16X20 MAGNETIC DRP (DRAPES) ×2 IMPLANT
BAG URINE DRAINAGE (UROLOGICAL SUPPLIES) ×2 IMPLANT
CANISTER SUCTION 2500CC (MISCELLANEOUS) ×2 IMPLANT
CHLORAPREP W/TINT 26ML (MISCELLANEOUS) ×2 IMPLANT
CLIP TI MEDIUM 6 (CLIP) ×1 IMPLANT
CLIP TI MEDIUM LARGE 6 (CLIP) IMPLANT
CLOTH BEACON ORANGE TIMEOUT ST (SAFETY) ×2 IMPLANT
COVER SURGICAL LIGHT HANDLE (MISCELLANEOUS) ×2 IMPLANT
DRAPE UTILITY XL STRL (DRAPES) ×2 IMPLANT
DRAPE WARM FLUID 44X44 (DRAPE) ×2 IMPLANT
DRSG TELFA 4X14 ISLAND ADH (GAUZE/BANDAGES/DRESSINGS) ×1 IMPLANT
ELECT BLADE 6.5 EXT (BLADE) ×2 IMPLANT
ELECT REM PT RETURN 9FT ADLT (ELECTROSURGICAL) ×2
ELECTRODE REM PT RTRN 9FT ADLT (ELECTROSURGICAL) ×1 IMPLANT
GAUZE SPONGE 4X4 16PLY XRAY LF (GAUZE/BANDAGES/DRESSINGS) ×1 IMPLANT
GLOVE BIOGEL M STRL SZ7.5 (GLOVE) ×10 IMPLANT
GOWN PREVENTION PLUS LG XLONG (DISPOSABLE) ×2 IMPLANT
GOWN STRL NON-REIN LRG LVL3 (GOWN DISPOSABLE) ×2 IMPLANT
GOWN STRL REIN XL XLG (GOWN DISPOSABLE) ×2 IMPLANT
LIGASURE IMPACT 36 18CM CVD LR (INSTRUMENTS) IMPLANT
NS IRRIG 1000ML POUR BTL (IV SOLUTION) ×6 IMPLANT
PACK ABDOMINAL WL (CUSTOM PROCEDURE TRAY) ×2 IMPLANT
SHEET LAVH (DRAPES) ×2 IMPLANT
SPONGE LAP 18X18 X RAY DECT (DISPOSABLE) ×2 IMPLANT
STAPLER SKIN PROX WIDE 3.9 (STAPLE) ×2 IMPLANT
STAPLER VISISTAT 35W (STAPLE) ×1 IMPLANT
SUT ETHILON 1 LR 30 (SUTURE) IMPLANT
SUT PDS AB 1 CTXB1 36 (SUTURE) ×4 IMPLANT
SUT SILK 2 0 (SUTURE)
SUT SILK 2 0 30  PSL (SUTURE)
SUT SILK 2 0 30 PSL (SUTURE) IMPLANT
SUT SILK 2-0 18XBRD TIE 12 (SUTURE) ×1 IMPLANT
SUT VIC AB 0 CT1 36 (SUTURE) ×4 IMPLANT
SUT VIC AB 2-0 CT2 27 (SUTURE) IMPLANT
SUT VIC AB 2-0 SH 27 (SUTURE) ×4
SUT VIC AB 2-0 SH 27X BRD (SUTURE) ×6 IMPLANT
SUT VIC AB 3-0 CTX 36 (SUTURE) IMPLANT
SUT VICRYL 2 0 18  UND BR (SUTURE) ×1
SUT VICRYL 2 0 18 UND BR (SUTURE) ×1 IMPLANT
TOWEL OR 17X26 10 PK STRL BLUE (TOWEL DISPOSABLE) ×2 IMPLANT
TOWEL OR NON WOVEN STRL DISP B (DISPOSABLE) ×2 IMPLANT
TRAY FOLEY CATH 14FRSI W/METER (CATHETERS) ×2 IMPLANT
WATER STERILE IRR 1500ML POUR (IV SOLUTION) ×1 IMPLANT

## 2013-04-18 NOTE — Transfer of Care (Signed)
Immediate Anesthesia Transfer of Care Note  Patient: Brenda Rivera  Procedure(s) Performed: Procedure(s): EXPLORATORY LAPAROTOMY BILATERAL SALPINGO OOPHORECTOMY  (Bilateral)  Patient Location: PACU  Anesthesia Type:General  Level of Consciousness: awake, alert  and oriented  Airway & Oxygen Therapy: Patient Spontanous Breathing and Patient connected to face mask oxygen  Post-op Assessment: Report given to PACU RN and Post -op Vital signs reviewed and stable  Post vital signs: Reviewed and stable  Complications: No apparent anesthesia complications

## 2013-04-18 NOTE — Op Note (Signed)
Brenda Rivera  female MEDICAL RECORD WG:[id number] DATE OF BIRTH: 1951/06/01 PHYSICIAN: De Blanch, M.D  04/18/2013   OPERATIVE REPORT  PREOPERATIVE DIAGNOSIS: Complex adnexal mass  POSTOPERATIVE DIAGNOSIS: Left ovarian serous cystadenoma  PROCEDURE: Bilateral salpingo-oophorectomy, left ureteral lysis.  SURGEON: De Blanch, M.D ASSISTANT: Antionette Char M.D. ANESTHESIA: Gen. with oral tracheal tube ESTIMATED BLOOD LOSS: 50 mL  SURGICAL FINDINGS: At exploratory laparotomy the left ovary was replaced by approximately 12 cm smooth-walled cystic mass. On frozen section this was found to be a serous cystadenoma. 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  PROCEDURE: Patient is brought to the operating room and after satisfactory attainment of general anesthesia was placed in a modified lithotomy position in [place] stirrups. The abdomen perineum and vagina were prepped, a Foley catheter was placed, the patient was draped. Surgical timeout was taken. Antibiotics were administered and SCDs were activated. The abdomen was entered through prior low midline incision excising the old scar. Upon entering the peritoneal cavity pelvic washings were obtained. The remainder the upper abdomen was explored and found to be normal. Bookwalter retractor was assembled. In order to gain adequate exposure the incision was extended around the umbilicus. The left retroperitoneal spaces opened identifying the vessels and ureter. The ovarian vessels were skeletonized clamped cut free tied and suture-ligated. In order to mobilize the ureter away from the site of the mass ureteral lysis was performed using sharp and blunt dissection and cautery for hemostasis. The round ligament was divided. The remainder of the pelvic sidewall peritoneum beneath the mass was incised. The mass is removed and sent to frozen section with the above-noted findings. Hemostasis achieved  with cautery.  The right side the pelvis was opened again identified vessels and ureter. The ovarian vessels were skeletonized clamped cut free tied and suture-ligated. The remainder the peritoneum beneath the right tube and ovary was incised in the right tube and ovary submitted to pathology.  The pelvis was irrigated and found to be hemostatic. The retractor and packs removed the anterior abdominal wall was closed in layers. The first being a running mass closure using #1 PDS. Subcutaneous tissue was irrigated and subcutaneous exparel was injected. Skin was closed skin staples a dressing was applied the patient was awakened from anesthesia and taken to the recovery room in satisfactory condition. Sponge needle and isthmic counts correct x2   De Blanch, M.D

## 2013-04-18 NOTE — Interval H&P Note (Signed)
History and Physical Interval Note:  04/18/2013 7:06 AM  Brenda Rivera  has presented today for surgery, with the diagnosis of PELVIC MASS  The various methods of treatment have been discussed with the patient and family. After consideration of risks, benefits and other options for treatment, the patient has consented to  Procedure(s): EXPLORATORY LAPAROTOMY BILATERAL SALPINGO OOPHORECTOMY WITH POSSIBLE STAGE AND DEBULKING  (Bilateral) as a surgical intervention .  The patient's history has been reviewed, patient examined, no change in status, stable for surgery.  I have reviewed the patient's chart and labs.  Questions were answered to the patient's satisfaction.     CLARKE-PEARSON,Kyanna Mahrt L

## 2013-04-18 NOTE — Anesthesia Postprocedure Evaluation (Signed)
  Anesthesia Post-op Note  Patient: Brenda Rivera  Procedure(s) Performed: Procedure(s) (LRB): EXPLORATORY LAPAROTOMY BILATERAL SALPINGO OOPHORECTOMY  (Bilateral)  Patient Location: PACU  Anesthesia Type: General  Level of Consciousness: awake and alert   Airway and Oxygen Therapy: Patient Spontanous Breathing  Post-op Pain: mild  Post-op Assessment: Post-op Vital signs reviewed, Patient's Cardiovascular Status Stable, Respiratory Function Stable, Patent Airway and No signs of Nausea or vomiting  Last Vitals:  Filed Vitals:   04/18/13 1010  BP: 146/81  Pulse: 61  Temp: 36.6 C  Resp: 12    Post-op Vital Signs: stable   Complications: No apparent anesthesia complications

## 2013-04-18 NOTE — Preoperative (Signed)
Beta Blockers   Reason not to administer Beta Blockers:Not Applicable 

## 2013-04-18 NOTE — H&P (View-Only) (Signed)
Consult Note: Gyn-Onc  Consult was requested by Dr. Gaynell Face for the evaluation of Brenda Rivera 62 y.o. female  CC: Pelvic mass   Assessment/Plan:  [name]  is a 62 y.o.  year old with a 40 cm pelvic mass. CA 125 less than a year ago within normal limits. A CA 125 will be repeated today. The recommendation presented to her was that of exploratory laparotomy bilateral salpingo-oophorectomy with staging and debulking as indicated by a frozen pathology findings. Risks of procedure discussed with the patient her inclusive of infection bleeding damage to the structures a prolonged hospitalization. Patient is aware that her surgeon will be Dr. De Blanch of the procedure will take place on 04/18/2013. All of her questions were answered to her satisfaction   HPI: Brenda Rivera a 62 year old nulliparous female who is been aware of the presence of a pelvic mass over 1 year.  Patient has not been compliant with prior appointments because of insurance issues. Pelvic UTZ 04/2012  There is a cystic midline pelvic oval mass with homogeneous internal echoes measuring overall 12.9 x 10.8 x 7.5 cm. Uterus and ovaries not visualized. Large midline cystic mass with imaging features suggestive of endometrioma in the appropriate clinical context. Otherwise, inclusion cyst, duplication cyst. lymphangioma, or less likely cystic neoplasm could have a similar appearance Ca 125 04/2012 10.9.  Patient denies abdominal bloating, reports pressure.  No changes in bowel or bladder habits.  No nausea vomiting, no change in appetite no early satiety.   Current Meds:  Outpatient Encounter Prescriptions as of 03/21/2013  Medication Sig Dispense Refill  . aspirin 81 MG tablet Take 81 mg by mouth daily.      Marland Kitchen atorvastatin (LIPITOR) 20 MG tablet Take 20 mg by mouth daily.      . Cholecalciferol (VITAMIN D) 1000 UNITS capsule Take 1,000 Units by mouth daily.      Marland Kitchen diltiazem (TAZTIA XT) 120 MG 24 hr capsule Take 120 mg  by mouth daily.      . famotidine (PEPCID) 20 MG tablet Take 20 mg by mouth daily. One each morning before breakfast      . levothyroxine (SYNTHROID, LEVOTHROID) 50 MCG tablet Take 50 mcg by mouth daily before breakfast.       No facility-administered encounter medications on file as of 03/21/2013.    Allergy:  Allergies  Allergen Reactions  . Vibramycin (Doxycycline) Rash    Social Hx:   History   Social History  . Marital Status: Married    Spouse Name: N/A    Number of Children: N/A  . Years of Education: N/A   Occupational History  . Not on file.   Social History Main Topics  . Smoking status: Former Smoker    Quit date: 03/01/1989  . Smokeless tobacco: Not on file     Comment: pt unsure of exact quit date but it has been 4yrs since she quit.  . Alcohol Use: No  . Drug Use: No  . Sexually Active: No   Other Topics Concern  . Not on file   Social History Narrative  . No narrative on file    Past Surgical LK:[id number]  Past Medical Hx: No past medical history on file.  Past Gynecological History:  G0 Menarche 12 regular menses until hysterectomy  for leiomyoma.  Family Hx: No family history on file.  Review of Systems:  Constitutional  Feels well,  Denies weight loss or malaise. Cardiovascular  No chest pain, shortness of  breath, or edema  Pulmonary  No cough or wheeze.  Gastro Intestinal  No nausea, vomitting, or diarrhoea. No bright red blood per rectum, no abdominal pain, change in bowel movement, or constipation. No abdominal bloating or early satiety Genito Urinary  No frequency, urgency, dysuria,no vaginal bleeding or discharge Musculo Skeletal  No myalgia, arthralgia, joint swelling or pain  Neurologic  No weakness, numbness, change in gait,  Psychology  No depression, anxiety, insomnia.   Vitals: BP 142/76  Pulse 68  Temp(Src) 98 F (36.7 C) (Oral)  Resp 16  Ht 5' 3.35" (1.609 m)  Wt 153 lb 4.8 oz (69.536 kg)  BMI 26.86  kg/m2  Physical Exam: WD in NAD Neck  Supple NROM, without any enlargements.  Lymph Node Survey No cervical supraclavicular or inguinal adenopathy Cardiovascular  Pulse normal rate, regularity and rhythm. S1 and S2 normal.  Lungs  Clear to auscultation bilateraly, without wheezes/crackles/rhonchi. Good air movement.  Psychiatry  Alert and oriented to person, place, and time  Abdomen  Normoactive bowel sounds, abdomen soft, non-tender and obese. Midline surgical  sites intact without evidence of hernia. Mass palpable Back No CVA tenderness Genito Urinary  Vulva/vagina: Normal external female genitalia.  No lesions. No discharge or bleeding.  Bladder/urethra:  No lesions or masses  Vagina:very atrophic, no discharge or bleeding  Adnexa: Firm 14cm pelvic mass.  No cul de sac nodularity  Rectal  Good tone, no masses no cul de sac nodularity. Mass palpable. Extremities  No bilateral cyanosis, clubbing or edema.   Laurette Schimke, MD, PhD 03/21/2013, 11:34 AM

## 2013-04-19 ENCOUNTER — Encounter (HOSPITAL_COMMUNITY): Payer: Self-pay | Admitting: Gynecology

## 2013-04-19 LAB — BASIC METABOLIC PANEL
BUN: 13 mg/dL (ref 6–23)
Calcium: 9.1 mg/dL (ref 8.4–10.5)
Creatinine, Ser: 1.13 mg/dL — ABNORMAL HIGH (ref 0.50–1.10)
GFR calc non Af Amer: 50 mL/min — ABNORMAL LOW (ref >90)
Glucose, Bld: 110 mg/dL — ABNORMAL HIGH (ref 70–99)
Potassium: 3.8 mEq/L (ref 3.5–5.1)

## 2013-04-19 LAB — CBC
Hemoglobin: 11.1 g/dL — ABNORMAL LOW (ref 12.0–15.0)
MCH: 26.7 pg (ref 26.0–34.0)
MCHC: 31.6 g/dL (ref 30.0–36.0)
RDW: 14.8 % (ref 11.5–15.5)

## 2013-04-19 MED ORDER — LEVOTHYROXINE SODIUM 50 MCG PO TABS
50.0000 ug | ORAL_TABLET | Freq: Every day | ORAL | Status: DC
  Administered 2013-04-19: 50 ug via ORAL
  Filled 2013-04-19 (×4): qty 1

## 2013-04-19 MED ORDER — IBUPROFEN 600 MG PO TABS
600.0000 mg | ORAL_TABLET | Freq: Four times a day (QID) | ORAL | Status: DC
  Administered 2013-04-19 – 2013-04-21 (×8): 600 mg via ORAL
  Filled 2013-04-19 (×11): qty 1

## 2013-04-19 NOTE — Care Management Note (Signed)
    Page 1 of 1   04/19/2013     11:27:15 AM   CARE MANAGEMENT NOTE 04/19/2013  Patient:  Brenda Rivera, Brenda Rivera   Account Number:  [id number]  Date Initiated:  04/19/2013  Documentation initiated by:  Lorenda Ishihara  Subjective/Objective Assessment:   62 yo female admitted s/p exploratory lap with BSO and ureteral lysis of adhesions.     Action/Plan:   Home when stable   Anticipated DC Date:  04/22/2013   Anticipated DC Plan:  HOME/SELF CARE      DC Planning Services  CM consult      Choice offered to / List presented to:             Status of service:  Completed, signed off Medicare Important Message given?   (If response is "NO", the following Medicare IM given date fields will be blank) Date Medicare IM given:   Date Additional Medicare IM given:    Discharge Disposition:  HOME/SELF CARE  Per UR Regulation:  Reviewed for med. necessity/level of care/duration of stay  If discussed at Long Length of Stay Meetings, dates discussed:    Comments:

## 2013-04-19 NOTE — Progress Notes (Signed)
1 Day Post-Op Procedure(s) (LRB): EXPLORATORY LAPAROTOMY BILATERAL SALPINGO OOPHORECTOMY  (Bilateral)  Subjective: Patient reports tolerating diet.  Ambulating with assistance.  Minimal pain reported.  No nausea or emesis reported.  Denies chest pain, dyspnea, passing flatus, or having a bowel movement.  No concerns voiced.    Objective: Vital signs in last 24 hours: Temp:  [97.1 F (36.2 C)-98.7 F (37.1 C)] 98.7 F (37.1 C) (07/23 0700) Pulse Rate:  [56-97] 73 (07/23 0700) Resp:  [11-23] 18 (07/23 0700) BP: (107-150)/(59-87) 133/84 mmHg (07/23 0700) SpO2:  [95 %-100 %] 95 % (07/23 0700) Weight:  [153 lb 6.4 oz (69.582 kg)] 153 lb 6.4 oz (69.582 kg) (07/22 1100) Last BM Date: 04/18/13  Intake/Output from previous day: 07/22 0701 - 07/23 0700 In: 3421.7 [P.O.:702; I.V.:2719.7] Out: 1650 [Urine:1650]  Physical Examination: General: alert, cooperative and no distress Resp: clear to auscultation bilaterally Cardio: regular rate and rhythm, S1, S2 normal, no murmur, click, rub or gallop GI: soft, non-tender; bowel sounds normal; no masses,  no organomegaly and incision: midline incision with staples open to air, clean, dry, and intact Extremities: extremities normal, atraumatic, no cyanosis or edema  Labs: WBC/Hgb/Hct/Plts:  7.9/11.1/35.1/187 (07/23 0355) BUN/Cr/glu/ALT/AST/amyl/lip:  13/1.13/--/--/--/--/-- (07/23 0355)  Assessment: 62 y.o. s/p Procedure(s): EXPLORATORY LAPAROTOMY BILATERAL SALPINGO OOPHORECTOMY : stable Pain:  Pain is well-controlled on PRN medications.  Heme:  Hgb 11.1 and Hct 35.1 this am.  Stable post-operatively.  CV: Hx Hypertension.  BP and HR stable post-operatively.  Current treatment:  diltiazem (Cardizem).  GI:  Tolerating po: Yes     GU:  Creatinine 1.13 this am.  Repeat labs in am.  FEN:  Stable post-operatively.    Prophylaxis: intermittent pneumatic compression boots.  Plan: Advance diet Encourage ambulation Advance to PO  medication Discontinue IV fluids Bmet in the am Encourage IS use, deep breathing, and coughing Continue post-operative plan of care   LOS: 1 day    CROSS, MELISSA DEAL 04/19/2013, 8:41 AM

## 2013-04-20 LAB — BASIC METABOLIC PANEL
Calcium: 8.8 mg/dL (ref 8.4–10.5)
GFR calc Af Amer: 64 mL/min — ABNORMAL LOW (ref >90)
GFR calc non Af Amer: 56 mL/min — ABNORMAL LOW (ref >90)
Glucose, Bld: 102 mg/dL — ABNORMAL HIGH (ref 70–99)
Sodium: 136 mEq/L (ref 135–145)

## 2013-04-20 MED ORDER — ACETAMINOPHEN 500 MG PO TABS
1000.0000 mg | ORAL_TABLET | Freq: Four times a day (QID) | ORAL | Status: DC
  Administered 2013-04-20 – 2013-04-21 (×4): 1000 mg via ORAL
  Filled 2013-04-20 (×4): qty 2

## 2013-04-20 MED ORDER — BISACODYL 10 MG RE SUPP
10.0000 mg | Freq: Once | RECTAL | Status: AC
  Administered 2013-04-20: 10 mg via RECTAL
  Filled 2013-04-20: qty 1

## 2013-04-20 NOTE — Progress Notes (Signed)
2 Days Post-Op Procedure(s) (LRB): EXPLORATORY LAPAROTOMY BILATERAL SALPINGO OOPHORECTOMY  (Bilateral)  Subjective: Patient reports doing well.  Tolerating diet.  Ambulating with assistance.  Minimal pain reported.  No nausea or emesis reported.  Denies chest pain, dyspnea, passing flatus, or having a bowel movement.  No concerns voiced.    Objective: Vital signs in last 24 hours: Temp:  [97.6 F (36.4 C)-98.1 F (36.7 C)] 98 F (36.7 C) (07/24 0616) Pulse Rate:  [65-82] 65 (07/24 0616) Resp:  [18] 18 (07/24 0616) BP: (110-136)/(65-83) 133/65 mmHg (07/24 0616) SpO2:  [93 %-99 %] 95 % (07/24 0616) Last BM Date: 04/18/13  Intake/Output from previous day: 07/23 0701 - 07/24 0700 In: 1417.5 [P.O.:1200; I.V.:217.5] Out: 3100 [Urine:3100]  Physical Examination: General: alert, cooperative and no distress Resp: clear to auscultation bilaterally Cardio: regular rate and rhythm, S1, S2 normal, no murmur, click, rub or gallop GI: incision: midline incision with staples open to air, clean, dry, and intact and abdomen mildly distended and tympanic, active bowel sounds noted, non-tender Extremities: extremities normal, atraumatic, no cyanosis or edema  Labs:   BUN/Cr/glu/ALT/AST/amyl/lip:  14/1.04/--/--/--/--/-- (07/24 0347)  Assessment: 62 y.o. s/p Procedure(s): EXPLORATORY LAPAROTOMY BILATERAL SALPINGO OOPHORECTOMY : stable Pain:  Pain is well-controlled on PRN medications.  Heme:  Hgb 11.1 and Hct 35.1 04/19/13.  Stable post-operatively.  CV: Hx Hypertension.  BP and HR stable post-operatively.  Current treatment:  diltiazem (Cardizem).  GI:  Tolerating po: Yes.     GU:  Creatinine 1.04 this am from 1.13.  FEN:  Stable post-operatively.    Prophylaxis: intermittent pneumatic compression boots.  Plan: Dulcolax suppository this am Encourage IS use, deep breathing, and coughing Continue post-operative plan of care   LOS: 2 days    CROSS, MELISSA DEAL 04/20/2013, 9:25  AM

## 2013-04-21 MED ORDER — OXYCODONE HCL 5 MG PO TABS: 5.0000 mg | ORAL_TABLET | ORAL | Status: DC | PRN

## 2013-04-21 NOTE — Discharge Summary (Signed)
Physician Discharge Summary  Patient ID: Brenda Rivera MRN: [id number] DOB/AGE: [birth year] 62 y.o.  Admit date: 04/18/2013 Discharge date: 04/21/2013  Admission Diagnoses: Pelvic mass in female  Discharge Diagnoses:  Principal Problem:   Pelvic mass in female  Discharged Condition:  The patient is in good condition and stable for discharge.    Hospital Course: On 04/18/2013, the patient underwent the following: Procedure(s):  EXPLORATORY LAPAROTOMY BILATERAL SALPINGO OOPHORECTOMY .  The postoperative course was uneventful.  She was discharged to home on postoperative day 3 tolerating a regular diet.  Consults: None  Significant Diagnostic Studies: None  Treatments: surgery: see above  Discharge Exam: Blood pressure 120/74, pulse 64, temperature 98.3 F (36.8 C), temperature source Oral, resp. rate 18, height 5\' 4"  (1.626 m), weight 153 lb 6.4 oz (69.582 kg), SpO2 95.00%. General appearance: alert, cooperative and no distress Resp: clear to auscultation bilaterally Cardio: regular rate and rhythm, S1, S2 normal, no murmur, click, rub or gallop GI: soft, non-tender; bowel sounds normal; no masses,  no organomegaly Extremities: extremities normal, atraumatic, no cyanosis or edema Incision/Wound: Midline incision with staples clean, dry, and intact  Disposition: Home  Discharge Orders   Future Appointments Provider Department Dept Phone   04/25/2013 11:45 AM Doylene Bode, NP [facility] [phone]   05/26/2013 10:45 AM Jeannette Corpus, MD [facility] [phone]   Future Orders Complete By Expires     Call MD for:  difficulty breathing, headache or visual disturbances  As directed     Call MD for:  extreme fatigue  As directed     Call MD for:  hives  As directed     Call MD for:  persistant dizziness or light-headedness  As directed     Call MD for:  persistant nausea and vomiting  As directed      Call MD for:  redness, tenderness, or signs of infection (pain, swelling, redness, odor or green/yellow discharge around incision site)  As directed     Call MD for:  severe uncontrolled pain  As directed     Call MD for:  temperature >100.4  As directed     Diet - low sodium heart healthy  As directed     Driving Restrictions  As directed     Comments:      No driving for 2 weeks.  Do not take narcotics and drive.    Increase activity slowly  As directed     Lifting restrictions  As directed     Comments:      No lifting greater than 10 lbs.    Sexual Activity Restrictions  As directed     Comments:      No sexual activity, nothing in the vagina, for 6 weeks.        Medication List         aspirin EC 81 MG tablet  Take 81 mg by mouth every morning.     atorvastatin 20 MG tablet  Commonly known as:  LIPITOR  Take 20 mg by mouth daily.     famotidine 20 MG tablet  Commonly known as:  PEPCID  Take 20 mg by mouth every morning.     levothyroxine 50 MCG tablet  Commonly known as:  SYNTHROID, LEVOTHROID  Take 50 mcg by mouth daily before breakfast.     oxyCODONE 5 MG immediate release tablet  Commonly known as:  Oxy IR/ROXICODONE  Take 1 tablet (  5 mg total) by mouth every 4 (four) hours as needed.     TAZTIA XT 120 MG 24 hr capsule  Generic drug:  diltiazem  Take 120 mg by mouth daily.     Vitamin D 1000 UNITS capsule  Take 1,000 Units by mouth daily.           Follow-up Information   Follow up with Erika Slaby DEAL, NP On 04/25/2013. (for staple removal at the [facility])    Contact information:   [address] [place] [address] [phone]       Follow up with Jeannette Corpus, MD On 05/26/2013. (at the [facility] for post-op evaluation)    Contact information:   [address] [place] [address] [phone]       Greater than thirty minutes were spend for face to face discharge instructions and discharge orders/summary  in EPIC.   Signed: Jaymee Tilson DEAL 04/21/2013, 9:16 AM

## 2013-04-25 ENCOUNTER — Ambulatory Visit: Payer: No Typology Code available for payment source | Attending: Gynecologic Oncology | Admitting: Gynecologic Oncology

## 2013-04-25 ENCOUNTER — Encounter: Payer: Self-pay | Admitting: Gynecologic Oncology

## 2013-04-25 VITALS — BP 126/74 | HR 68 | Temp 98.6°F | Resp 16 | Ht 63.35 in | Wt 153.5 lb

## 2013-04-25 DIAGNOSIS — R1900 Intra-abdominal and pelvic swelling, mass and lump, unspecified site: Secondary | ICD-10-CM

## 2013-04-25 NOTE — Patient Instructions (Signed)
Plan to follow up as scheduled.  Please call for any questions or concerns.

## 2013-04-25 NOTE — Progress Notes (Signed)
Follow Up Note: Gyn-Onc  Brenda Rivera 62 y.o. female  CC:  Chief Complaint  Patient presents with  . Post-op follow up, staple removal    Follow up    HPI:  Brenda Rivera a 62 year old nulliparous female who is been aware of the presence of a pelvic mass over 1 year. Patient has not been compliant with prior appointments because of insurance issues.  Pelvic UTZ on 04/2012 resulted:  There is a cystic midline pelvic oval mass with homogeneous internal echoes measuring overall 12.9 x 10.8 x 7.5 cm. Uterus and ovaries not visualized. Large midline cystic mass with imaging features suggestive of endometrioma in the appropriate clinical context. Otherwise, inclusion cyst, duplication cyst. lymphangioma, or less likely cystic neoplasm could have a similar appearance.  Ca 125 on 04/2012 10.9.  On 04/18/13, she underwent a bilateral salpingo-oophorectomy and left ureteral lysis by Dr. Stanford Breed.  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  Her post-operative course was uneventful.      Interval History:  She presents today alone for post-operative follow up and staple removal.  She describes expected post operative status.  Adequate PO intake reported.  Bowels and bladder functioning without difficulty.  Pain minimal with the use of ibuprofen for pain.  No concerns voiced.    Review of Systems  Constitutional: Feels well.  No fever, chills, early satiety, change in appetite, or change in weight. Cardiovascular: No chest pain, shortness of breath, or edema.  Pulmonary: No cough or wheeze.  Gastrointestinal: No nausea, vomiting, or diarrhea. No bright red blood per rectum or change in bowel movement.  Genitourinary: No frequency, urgency, or dysuria. No vaginal bleeding or discharge.  Musculoskeletal: No myalgia or joint pain. Neurologic: No weakness, numbness, or change in gait.  Psychology: No depression, anxiety, or insomnia.  Current Meds:  Outpatient  Encounter Prescriptions as of 04/25/2013  Medication Sig Dispense Refill  . aspirin EC 81 MG tablet Take 81 mg by mouth every morning.      Marland Kitchen atorvastatin (LIPITOR) 20 MG tablet Take 20 mg by mouth daily.      . Cholecalciferol (VITAMIN D) 1000 UNITS capsule Take 1,000 Units by mouth daily.      Marland Kitchen diltiazem (TAZTIA XT) 120 MG 24 hr capsule Take 120 mg by mouth daily.      . famotidine (PEPCID) 20 MG tablet Take 20 mg by mouth every morning.       Marland Kitchen levothyroxine (SYNTHROID, LEVOTHROID) 50 MCG tablet Take 50 mcg by mouth daily before breakfast.      . oxyCODONE (OXY IR/ROXICODONE) 5 MG immediate release tablet Take 1 tablet (5 mg total) by mouth every 4 (four) hours as needed.  10 tablet  0   No facility-administered encounter medications on file as of 04/25/2013.    Allergy:  Allergies  Allergen Reactions  . Vibramycin (Doxycycline) Rash    Social Hx:   History   Social History  . Marital Status: Married    Spouse Name: N/A    Number of Children: N/A  . Years of Education: N/A   Occupational History  . Not on file.   Social History Main Topics  . Smoking status: Former Smoker -- 20 years    Types: Cigarettes    Quit date: 03/01/1989  . Smokeless tobacco: Never Used     Comment: pt unsure of exact quit date but it has been 46yrs since she quit.  . Alcohol Use: No  . Drug  Use: No  . Sexually Active: No   Other Topics Concern  . Not on file   Social History Narrative  . No narrative on file    Past Surgical Hx:  Past Surgical History  Procedure Laterality Date  . Thyroidectomy  over 10  years ago  . Abdominal hysterectomy  30 years ago    partial  . Cardiac catheterization    . Laparotomy Bilateral 04/18/2013    Procedure: EXPLORATORY LAPAROTOMY BILATERAL SALPINGO OOPHORECTOMY ;  Surgeon: Jeannette Corpus, MD;  Location: [facility];  Service: Gynecology;  Laterality: Bilateral;    Past Medical Hx:  Past Medical History  Diagnosis Date  . Hypertension   .  Hypercholesteremia   . Hypothyroidism   . GERD (gastroesophageal reflux disease)     Family Hx: History reviewed. No pertinent family history.  Vitals:  Blood pressure 126/74, pulse 68, temperature 98.6 F (37 C), resp. rate 16, height 5' 3.35" (1.609 m), weight 153 lb 8 oz (69.627 kg).  Physical Exam:  General: Well developed, well nourished female in no acute distress. Alert and oriented x 3.  Neck: Supple without any enlargements.  Lymph node survey: No cervical, supraclavicular, or inguinal adenopathy  Cardiovascular: Regular rate and rhythm. S1 and S2 normal.  Lungs: Clear to auscultation bilaterally. No wheezes/crackles/rhonchi noted.  Skin: No rashes or lesions present. Back: No CVA tenderness.  Abdomen: Abdomen soft, non-tender and non-obese. Active bowel sounds in all quadrants.  20 staples removed from the midline incision without difficulty.  Steri-strips applied.  No erythema or drainage present.  Incision clean, dry, and intact.  Extremities: No bilateral cyanosis, edema, or clubbing.   Assessment/Plan:  62 year old s/p bilateral salpingo-oophorectomy with left ureteral lysis by Dr. Stanford Breed on 04/18/13.  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  She is doing well post-operatively.  She is advised to keep her follow up appointment with Dr. Stanford Breed on 05/26/13 for post-operative follow up.  Incision care reinforced.  She is advised to call for any questions or concerns.  Reportable signs and symptoms reviewed.     CROSS, MELISSA DEAL, NP 04/25/2013, 2:32 PM

## 2013-05-26 ENCOUNTER — Encounter: Payer: Self-pay | Admitting: Gynecology

## 2013-05-26 ENCOUNTER — Ambulatory Visit: Payer: No Typology Code available for payment source | Attending: Gynecology | Admitting: Gynecology

## 2013-05-26 VITALS — BP 132/82 | HR 109 | Temp 98.4°F | Resp 16 | Ht 63.0 in | Wt 152.3 lb

## 2013-05-26 DIAGNOSIS — N83209 Unspecified ovarian cyst, unspecified side: Secondary | ICD-10-CM

## 2013-05-26 DIAGNOSIS — D279 Benign neoplasm of unspecified ovary: Secondary | ICD-10-CM | POA: Insufficient documentation

## 2013-05-26 DIAGNOSIS — E039 Hypothyroidism, unspecified: Secondary | ICD-10-CM | POA: Insufficient documentation

## 2013-05-26 DIAGNOSIS — K219 Gastro-esophageal reflux disease without esophagitis: Secondary | ICD-10-CM | POA: Insufficient documentation

## 2013-05-26 DIAGNOSIS — I10 Essential (primary) hypertension: Secondary | ICD-10-CM | POA: Insufficient documentation

## 2013-05-26 DIAGNOSIS — Z79899 Other long term (current) drug therapy: Secondary | ICD-10-CM | POA: Insufficient documentation

## 2013-05-26 DIAGNOSIS — R1900 Intra-abdominal and pelvic swelling, mass and lump, unspecified site: Secondary | ICD-10-CM

## 2013-05-26 DIAGNOSIS — E7800 Pure hypercholesterolemia, unspecified: Secondary | ICD-10-CM | POA: Insufficient documentation

## 2013-05-26 DIAGNOSIS — Z9079 Acquired absence of other genital organ(s): Secondary | ICD-10-CM | POA: Insufficient documentation

## 2013-05-26 NOTE — Patient Instructions (Signed)
You may return to full levels of activity. Please see Dr. Gaynell Face for your angle exam.

## 2013-05-26 NOTE — Progress Notes (Signed)
Consult Note: Gyn-Onc   Brenda Rivera 62 y.o. female  Chief Complaint  Patient presents with  . 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 Patient's had an uncomplicated postoperative course.  Plan: Patient to return for levels of activity and will see Dr. Gaynell Face for continuing gynecologic care. Interval History: Patient returns today for a six-week postoperative checkup. Since surgery she's done well she has no complaints she has normal GI and GU function she has no dominant pain  HPI: Patient presented with a large left pelvic mass and underwent exploratory laparotomy and bilateral salpingo-oophorectomy on 04/18/2013. 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 Patient's postoperative course was uncomplicated.  Review of Systems:10 point review of systems is negative except as noted in interval history.   Vitals: Blood pressure 132/82, pulse 109, temperature 98.4 F (36.9 C), temperature source Oral, resp. rate 16, height 5\' 3"  (1.6 m), weight 152 lb 4.8 oz (69.083 kg).  Physical Exam: General : The patient is a healthy woman in no acute distress.  HEENT: normocephalic, extraoccular movements normal; neck is supple without thyromegally  Lynphnodes: Supraclavicular and inguinal nodes not enlarged  Abdomen: Soft, non-tender, no ascites, no organomegally, no masses, no hernias , midline incision is well-healed. Pelvic:  EGBUS: Normal female  Vagina: Normal, no lesions  Urethra and Bladder: Normal, non-tender  Cervix: Surgically absent  Uterus: Surgically absent  Bi-manual examination: Non-tender; no adenxal masses or nodularity  Rectal: normal sphincter tone, no masses, no blood  Lower extremities: No edema or varicosities. Normal range of motion      Allergies  Allergen Reactions  . Vibramycin [Doxycycline] Rash    Past Medical History  Diagnosis Date  . Hypertension   .  Hypercholesteremia   . Hypothyroidism   . GERD (gastroesophageal reflux disease)     Past Surgical History  Procedure Laterality Date  . Thyroidectomy  over 10  years ago  . Abdominal hysterectomy  30 years ago    partial  . Cardiac catheterization    . Laparotomy Bilateral 04/18/2013    Procedure: EXPLORATORY LAPAROTOMY BILATERAL SALPINGO OOPHORECTOMY ;  Surgeon: Jeannette Corpus, MD;  Location: [facility];  Service: Gynecology;  Laterality: Bilateral;    Current Outpatient Prescriptions  Medication Sig Dispense Refill  . aspirin EC 81 MG tablet Take 81 mg by mouth every morning.      Marland Kitchen atorvastatin (LIPITOR) 20 MG tablet Take 20 mg by mouth daily.      . Cholecalciferol (VITAMIN D) 1000 UNITS capsule Take 1,000 Units by mouth daily.      Marland Kitchen diltiazem (TAZTIA XT) 120 MG 24 hr capsule Take 120 mg by mouth daily.      . famotidine (PEPCID) 20 MG tablet Take 20 mg by mouth every morning.       Marland Kitchen levothyroxine (SYNTHROID, LEVOTHROID) 50 MCG tablet Take 50 mcg by mouth daily before breakfast.      . oxyCODONE (OXY IR/ROXICODONE) 5 MG immediate release tablet Take 1 tablet (5 mg total) by mouth every 4 (four) hours as needed.  10 tablet  0   No current facility-administered medications for this visit.    History   Social History  . Marital Status: Married    Spouse Name: N/A    Number of Children: N/A  . Years of Education: N/A   Occupational History  . Not on file.   Social History  Main Topics  . Smoking status: Former Smoker -- 20 years    Types: Cigarettes    Quit date: 03/01/1989  . Smokeless tobacco: Never Used     Comment: pt unsure of exact quit date but it has been 61yrs since she quit.  . Alcohol Use: No  . Drug Use: No  . Sexual Activity: No   Other Topics Concern  . Not on file   Social History Narrative  . No narrative on file    No family history on file.    CLARKE-PEARSON,Akeem Heppler L, MD 05/26/2013, 11:40 AM

## 2013-11-13 ENCOUNTER — Other Ambulatory Visit: Payer: Self-pay | Admitting: Internal Medicine

## 2013-11-13 DIAGNOSIS — Z1231 Encounter for screening mammogram for malignant neoplasm of breast: Secondary | ICD-10-CM

## 2013-12-26 ENCOUNTER — Ambulatory Visit
Admission: RE | Admit: 2013-12-26 | Discharge: 2013-12-26 | Disposition: A | Discharge status: Home / Self Care | Payer: Medicare Other | Source: Ambulatory Visit | Attending: Internal Medicine | Admitting: Internal Medicine

## 2013-12-26 DIAGNOSIS — Z1231 Encounter for screening mammogram for malignant neoplasm of breast: Secondary | ICD-10-CM

## 2014-09-17 ENCOUNTER — Encounter: Payer: Self-pay | Admitting: Internal Medicine

## 2014-11-15 ENCOUNTER — Encounter: Payer: Medicare Other | Admitting: Internal Medicine

## 2015-01-17 ENCOUNTER — Encounter: Payer: Self-pay | Admitting: Internal Medicine

## 2015-01-30 IMAGING — CR DG CHEST 2V
2 series · 2 of 2 positions shown · non-contrast
Comparison: PA and lateral chest 07/03/2008.

CLINICAL DATA: Preoperative respiratory films.

CHEST - 2 VIEW

[w chest pa]
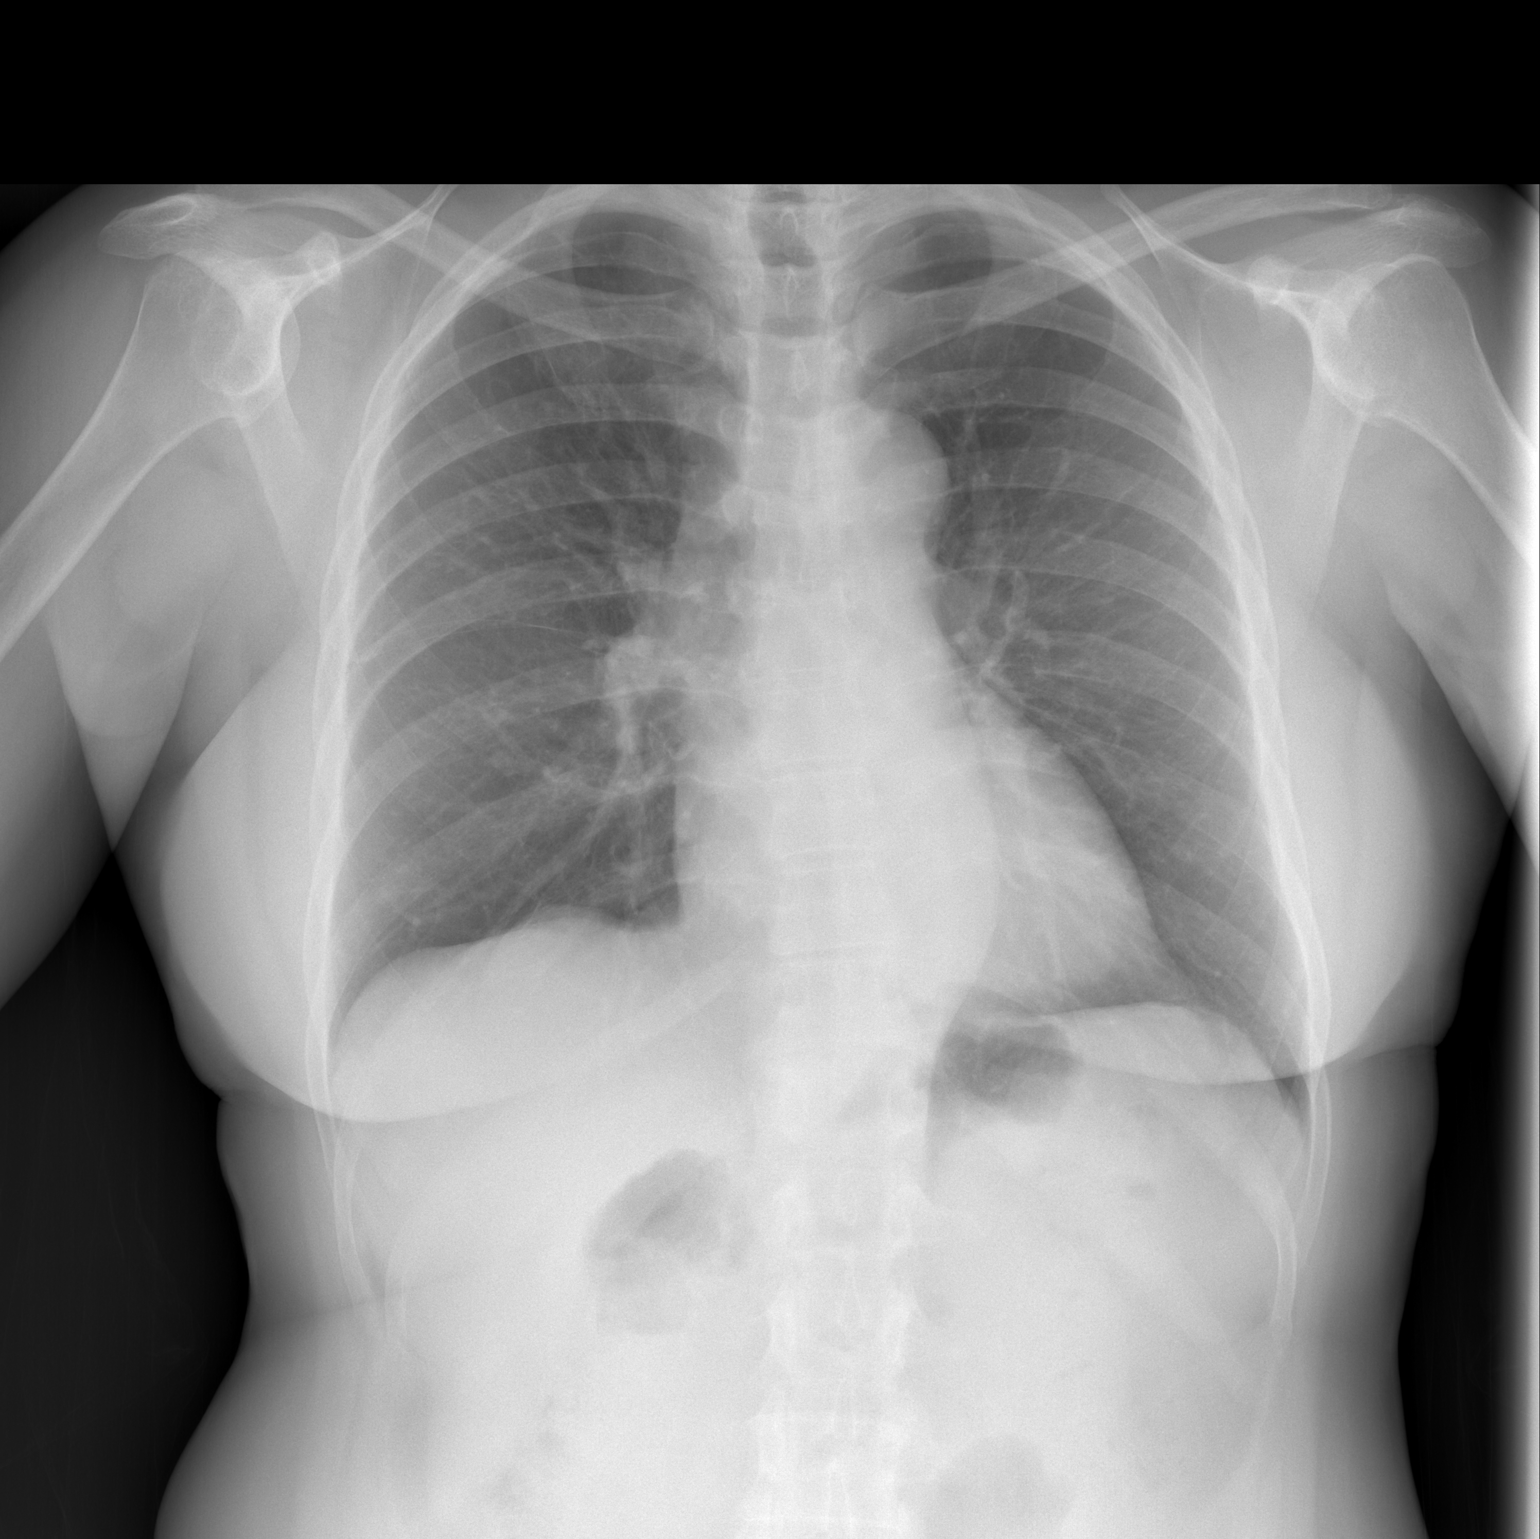

[w chest lat]
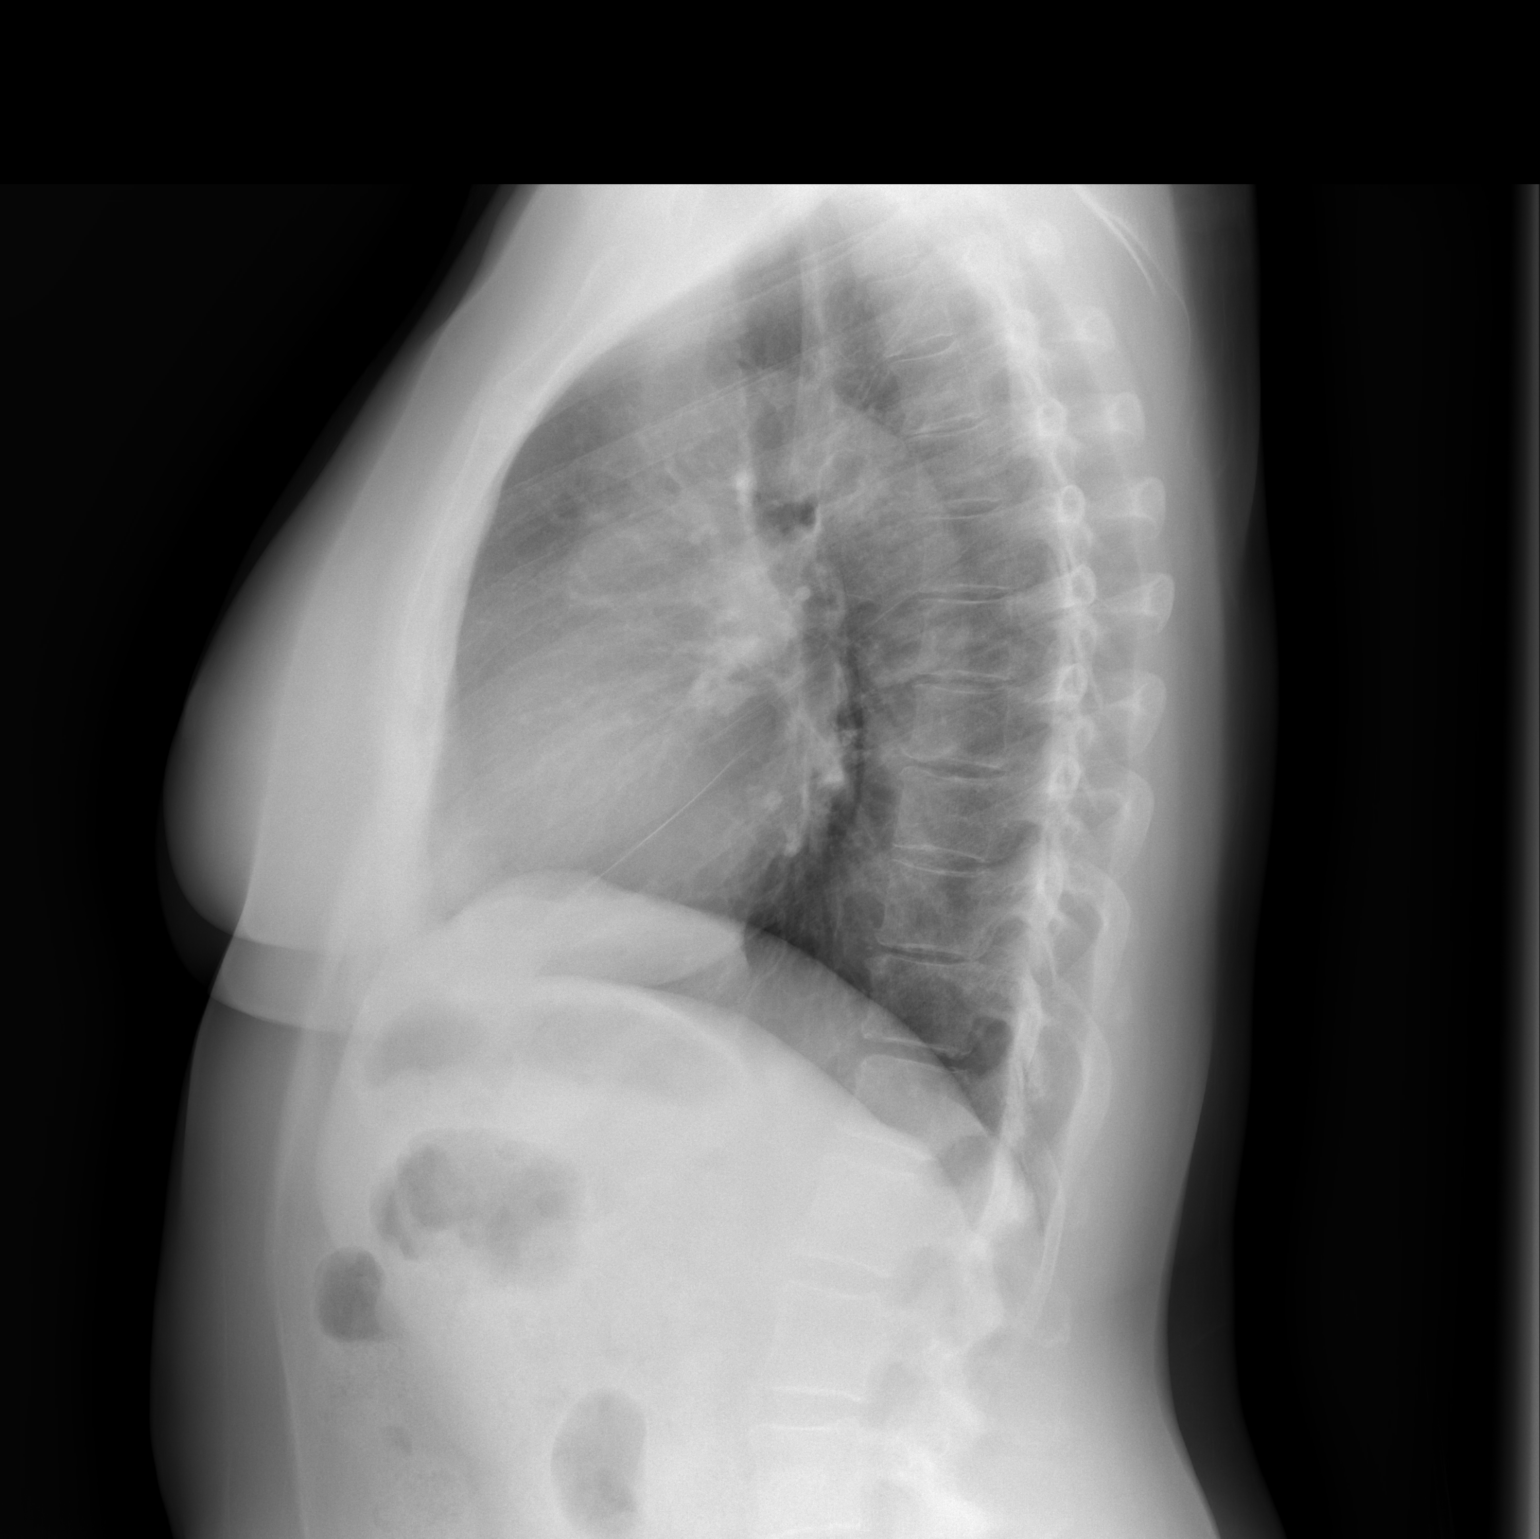

[2 of 2 positions shown; findings below may reference images not displayed]

FINDINGS: The lungs are clear.  Heart size is normal.  No
pneumothorax or pleural fluid.
IMPRESSION: Negative chest.

## 2015-03-19 ENCOUNTER — Other Ambulatory Visit: Payer: Self-pay

## 2015-03-19 ENCOUNTER — Other Ambulatory Visit: Payer: Self-pay | Admitting: Internal Medicine

## 2015-03-19 DIAGNOSIS — Z1231 Encounter for screening mammogram for malignant neoplasm of breast: Secondary | ICD-10-CM

## 2015-03-19 DIAGNOSIS — E2839 Other primary ovarian failure: Secondary | ICD-10-CM

## 2015-03-28 ENCOUNTER — Ambulatory Visit
Admission: RE | Admit: 2015-03-28 | Discharge: 2015-03-28 | Disposition: A | Discharge status: Home / Self Care | Payer: Medicare Other | Source: Ambulatory Visit | Attending: Internal Medicine | Admitting: Internal Medicine

## 2015-03-28 DIAGNOSIS — E2839 Other primary ovarian failure: Secondary | ICD-10-CM

## 2015-04-24 ENCOUNTER — Ambulatory Visit: Payer: Medicare Other

## 2015-06-11 ENCOUNTER — Ambulatory Visit
Admission: RE | Admit: 2015-06-11 | Discharge: 2015-06-11 | Disposition: A | Discharge status: Home / Self Care | Payer: Medicare Other | Source: Ambulatory Visit

## 2015-06-11 DIAGNOSIS — Z1231 Encounter for screening mammogram for malignant neoplasm of breast: Secondary | ICD-10-CM

## 2017-03-22 ENCOUNTER — Other Ambulatory Visit: Payer: Self-pay | Admitting: Internal Medicine

## 2017-03-22 DIAGNOSIS — Z1231 Encounter for screening mammogram for malignant neoplasm of breast: Secondary | ICD-10-CM

## 2018-03-17 ENCOUNTER — Other Ambulatory Visit: Payer: Self-pay | Admitting: Internal Medicine

## 2018-03-17 DIAGNOSIS — E2839 Other primary ovarian failure: Secondary | ICD-10-CM

## 2018-03-17 DIAGNOSIS — Z1231 Encounter for screening mammogram for malignant neoplasm of breast: Secondary | ICD-10-CM

## 2020-01-16 ENCOUNTER — Other Ambulatory Visit: Payer: Self-pay

## 2020-01-16 ENCOUNTER — Emergency Department (HOSPITAL_COMMUNITY)
Admission: EM | Admit: 2020-01-16 | Discharge: 2020-01-16 | Disposition: A | Discharge status: Home / Self Care | Payer: Medicare Other | Attending: Emergency Medicine | Admitting: Emergency Medicine

## 2020-01-16 ENCOUNTER — Encounter (HOSPITAL_COMMUNITY): Payer: Self-pay | Admitting: Emergency Medicine

## 2020-01-16 DIAGNOSIS — I10 Essential (primary) hypertension: Secondary | ICD-10-CM | POA: Insufficient documentation

## 2020-01-16 DIAGNOSIS — Z7982 Long term (current) use of aspirin: Secondary | ICD-10-CM | POA: Diagnosis not present

## 2020-01-16 DIAGNOSIS — E039 Hypothyroidism, unspecified: Secondary | ICD-10-CM | POA: Insufficient documentation

## 2020-01-16 DIAGNOSIS — K625 Hemorrhage of anus and rectum: Secondary | ICD-10-CM | POA: Diagnosis not present

## 2020-01-16 DIAGNOSIS — K921 Melena: Secondary | ICD-10-CM | POA: Diagnosis present

## 2020-01-16 DIAGNOSIS — Z79899 Other long term (current) drug therapy: Secondary | ICD-10-CM | POA: Diagnosis not present

## 2020-01-16 DIAGNOSIS — Z87891 Personal history of nicotine dependence: Secondary | ICD-10-CM | POA: Insufficient documentation

## 2020-01-16 LAB — CBC
HCT: 37.5 % (ref 36.0–46.0)
Hemoglobin: 11.6 g/dL — ABNORMAL LOW (ref 12.0–15.0)
MCH: 26.7 pg (ref 26.0–34.0)
MCHC: 30.9 g/dL (ref 30.0–36.0)
MCV: 86.4 fL (ref 80.0–100.0)
Platelets: 220 10*3/uL (ref 150–400)
RBC: 4.34 MIL/uL (ref 3.87–5.11)
RDW: 14.6 % (ref 11.5–15.5)
WBC: 7.7 10*3/uL (ref 4.0–10.5)
nRBC: 0 % (ref 0.0–0.2)

## 2020-01-16 LAB — COMPREHENSIVE METABOLIC PANEL
ALT: 18 U/L (ref 0–44)
AST: 19 U/L (ref 15–41)
Albumin: 3.5 g/dL (ref 3.5–5.0)
Alkaline Phosphatase: 73 U/L (ref 38–126)
Anion gap: 12 (ref 5–15)
BUN: 15 mg/dL (ref 8–23)
CO2: 26 mmol/L (ref 22–32)
Calcium: 9.1 mg/dL (ref 8.9–10.3)
Chloride: 104 mmol/L (ref 98–111)
Creatinine, Ser: 1.06 mg/dL — ABNORMAL HIGH (ref 0.44–1.00)
GFR calc Af Amer: >60 mL/min (ref >60)
GFR calc non Af Amer: 53 mL/min — ABNORMAL LOW (ref >60)
Glucose, Bld: 153 mg/dL — ABNORMAL HIGH (ref 70–99)
Potassium: 3.6 mmol/L (ref 3.5–5.1)
Sodium: 142 mmol/L (ref 135–145)
Total Bilirubin: 0.5 mg/dL (ref 0.3–1.2)
Total Protein: 6.7 g/dL (ref 6.5–8.1)

## 2020-01-16 LAB — ABO/RH: ABO/RH(D): B POS

## 2020-01-16 NOTE — Discharge Instructions (Addendum)
It is not clear why you have rectal bleeding.  You will likely have some more bleeding over the next several days.  If you are worried about the amount of bleeding, or getting worse in any way including pain, or dizziness, please return here for reevaluation.  Otherwise, see your doctor tomorrow for another blood test to see if your hemoglobin is dropping.  Your doctor may prefer to refer you to a gastroenterologist for further testing and treatment.  Call your doctor today for an appointment to be seen.

## 2020-01-16 NOTE — ED Provider Notes (Signed)
[facility] EMERGENCY DEPARTMENT Provider Note   CSN: [id number] Arrival date & time: 01/16/20  [id number]     History Chief Complaint  Patient presents with  . Blood In Stools    Brenda Rivera is a 69 y.o. female.  HPI She presents for evaluation of 2 episodes of bright red blood per rectum, this morning when she went to the bathroom as usual to have a bowel movement.  No other preceding symptoms.  No recent fever, chills, nausea, vomiting, anorexia, weakness or dizziness.  No prior colonoscopy.  No similar symptoms in the past.  There are no other known modifying factors.    Past Medical History:  Diagnosis Date  . GERD (gastroesophageal reflux disease)   . Hypercholesteremia   . Hypertension   . Hypothyroidism     Patient Active Problem List   Diagnosis Date Noted  . Ovarian cyst 05/26/2013  . Pelvic mass in female 03/21/2013    Past Surgical History:  Procedure Laterality Date  . ABDOMINAL HYSTERECTOMY  30 years ago   partial  . CARDIAC CATHETERIZATION    . LAPAROTOMY Bilateral 04/18/2013   Procedure: EXPLORATORY LAPAROTOMY BILATERAL SALPINGO OOPHORECTOMY ;  Surgeon: Alvino Chapel, MD;  Location: [facility];  Service: Gynecology;  Laterality: Bilateral;  . THYROIDECTOMY  over 10  years ago     OB History   No obstetric history on file.     No family history on file.  Social History   Tobacco Use  . Smoking status: Former Smoker    Years: 20.00    Types: Cigarettes    Quit date: 03/01/1989    Years since quitting: 30.8  . Smokeless tobacco: Never Used  . Tobacco comment: pt unsure of exact quit date but it has been 63yrs since she quit.  Substance Use Topics  . Alcohol use: No  . Drug use: No    Home Medications Prior to Admission medications   Medication Sig Start Date End Date Taking? Authorizing Provider  aspirin EC 81 MG tablet Take 81 mg by mouth every morning.   Yes [provider]  atorvastatin (LIPITOR) 20 MG  tablet Take 20 mg by mouth daily.   Yes [provider]  Cholecalciferol (VITAMIN D) 1000 UNITS capsule Take 1,000 Units by mouth daily.   Yes [provider]  diltiazem (TAZTIA XT) 120 MG 24 hr capsule Take 120 mg by mouth daily.   Yes [provider]  famotidine (PEPCID) 20 MG tablet Take 20 mg by mouth every morning.    Yes [provider]  levothyroxine (SYNTHROID, LEVOTHROID) 50 MCG tablet Take 50 mcg by mouth daily before breakfast.   Yes [provider]  oxyCODONE (OXY IR/ROXICODONE) 5 MG immediate release tablet Take 1 tablet (5 mg total) by mouth every 4 (four) hours as needed. Patient not taking: Reported on 01/16/2020 04/21/13   Joylene John D, NP    Allergies    Vibramycin [doxycycline]  Review of Systems   Review of Systems  All other systems reviewed and are negative.   Physical Exam Updated Vital Signs BP (!) 134/99 (BP Location: Right Arm)   Pulse (!) 101   Temp 98.4 F (36.9 C) (Oral)   Resp 18   Ht 5\' 4"  (1.626 m)   Wt 67.6 kg   SpO2 96%   BMI 25.58 kg/m   Physical Exam Vitals and nursing note reviewed.  Constitutional:      Appearance: She is well-developed.  HENT:  Head: Normocephalic and atraumatic.  Eyes:     Conjunctiva/sclera: Conjunctivae normal.     Pupils: Pupils are equal, round, and reactive to light.  Neck:     Trachea: Phonation normal.  Cardiovascular:     Rate and Rhythm: Normal rate and regular rhythm.  Pulmonary:     Effort: Pulmonary effort is normal.     Breath sounds: Normal breath sounds.  Chest:     Chest wall: No tenderness.  Abdominal:     General: There is no distension.     Palpations: Abdomen is soft.     Tenderness: There is no abdominal tenderness. There is no guarding.  Genitourinary:    Comments: Normal anus.  Bright red blood in rectal vault.  No rectal mass.  No external or internal hemorrhoids.  No anal fissure. Musculoskeletal:        General: Normal range of  motion.     Cervical back: Normal range of motion and neck supple.  Skin:    General: Skin is warm and dry.  Neurological:     Mental Status: She is alert and oriented to person, place, and time.     Motor: No abnormal muscle tone.  Psychiatric:        Mood and Affect: Mood normal.        Behavior: Behavior normal.        Thought Content: Thought content normal.        Judgment: Judgment normal.     ED Results / Procedures / Treatments   Labs (all labs ordered are listed, but only abnormal results are displayed) Labs Reviewed  COMPREHENSIVE METABOLIC PANEL - Abnormal; Notable for the following components:      Result Value   Glucose, Bld 153 (*)    Creatinine, Ser 1.06 (*)    GFR calc non Af Amer 53 (*)    All other components within normal limits  CBC - Abnormal; Notable for the following components:   Hemoglobin 11.6 (*)    All other components within normal limits  POC OCCULT BLOOD, ED  TYPE AND SCREEN  ABO/RH    EKG None  Radiology No results found.  Procedures Procedures (including critical care time)  Medications Ordered in ED Medications - No data to display  ED Course  I have reviewed the triage vital signs and the nursing notes.  Pertinent labs & imaging results that were available during my care of the patient were reviewed by me and considered in my medical decision making (see chart for details).  Clinical Course as of Jan 15 1050  Tue Jan 16, 2020  0909 Normal except glucose high, creatinine high, GFR low  Comprehensive metabolic panel(!) [EW]  [id number] Normal except hemoglobin low   [EW]    Clinical Course User Index [EW] Daleen Bo, MD   MDM Rules/Calculators/A&P                       Patient Vitals for the past 24 hrs:  BP Temp Temp src Pulse Resp SpO2 Height Weight  01/16/20 0845 (!) 134/99 -- -- (!) 101 18 96 % -- --  01/16/20 0643 -- -- -- -- -- -- 5\' 4"  (1.626 m) 67.6 kg  01/16/20 0642 (!) 153/92 98.4 F (36.9 C) Oral (!) 116 20  98 % -- --    10:51 AM Reevaluation with update and discussion. After initial assessment and treatment, an updated evaluation reveals she is comfortable.  She went to the  bathroom, to try and have a bowel movement, and did not.  She did not notice any rectal bleeding at that time.  Findings cussed with the patient and her husband, questions answered. Daleen Bo   Medical Decision Making:  This patient is presenting for evaluation of rectal bleeding, which does require a range of treatment options, and is a complaint that involves a moderate risk of morbidity and mortality. The differential diagnoses include intestinal bleeding of various sources, including infection, polyps, cancer. I decided  to review old records, and in summary healthy elderly female who has not previously had colonoscopy, and presents for isolated episode of rectal bleeding, which started today. I did not require additional historical information from anyone. Clinical Laboratory Tests Ordered, included CBC, chemistry, occult blood testing.  Chemistry panel remarkable only for mild elevation of glucose.  Hemoglobin 11.6 just slightly low.  Specimen off material from rectum indicating bright red blood.   Critical Interventions-clinical laboratory evaluation.  Observation, an attempt at bowel movement without bleeding.  After These Interventions, the Patient was reevaluated and was found to be stable.  Rectal bleeding, not profuse, without worrisome findings on history, clinical or physical examination.  Findings discussed with the patient who is agreeable to outpatient follow-up with her PCP for management.  She may require referral to GI for colonoscopy, after the acute phase of the illness.  It is not felt that imaging would be beneficial as she is not having pain, at this time.  CRITICAL CARE-no Performed by: Daleen Bo   Nursing Notes Reviewed/ Care Coordinated Applicable Imaging Reviewed Interpretation of  Laboratory Data incorporated into ED treatment  The patient appears reasonably screened and/or stabilized for discharge and I doubt any other medical condition or other [facility] requiring further screening, evaluation, or treatment in the ED at this time prior to discharge.  Plan: Home Medications-continue current; Home Treatments-fluids, low fiber diet; return here if the recommended treatment, does not improve the symptoms; Recommended follow up-PCP follow-up tomorrow for CBC testing, and further evaluation with possible referral to GI.    Final Clinical Impression(s) / ED Diagnoses Final diagnoses:  Rectal bleeding    Rx / DC Orders ED Discharge Orders    None       Daleen Bo, MD 01/16/20 1052

## 2020-01-16 NOTE — ED Triage Notes (Signed)
Pt reports she had bright red and dark read blood in her stool this morning. No other complaints or symptoms

## 2020-01-18 ENCOUNTER — Emergency Department (HOSPITAL_COMMUNITY)
Admission: EM | Admit: 2020-01-18 | Discharge: 2020-01-18 | Disposition: A | Discharge status: Home / Self Care | Payer: Medicare Other | Attending: Emergency Medicine | Admitting: Emergency Medicine

## 2020-01-18 ENCOUNTER — Encounter (HOSPITAL_COMMUNITY): Payer: Self-pay | Admitting: Emergency Medicine

## 2020-01-18 ENCOUNTER — Other Ambulatory Visit: Payer: Self-pay

## 2020-01-18 ENCOUNTER — Encounter: Payer: Self-pay | Admitting: Nurse Practitioner

## 2020-01-18 DIAGNOSIS — E039 Hypothyroidism, unspecified: Secondary | ICD-10-CM | POA: Diagnosis not present

## 2020-01-18 DIAGNOSIS — K921 Melena: Secondary | ICD-10-CM | POA: Diagnosis present

## 2020-01-18 DIAGNOSIS — U071 COVID-19: Secondary | ICD-10-CM | POA: Insufficient documentation

## 2020-01-18 DIAGNOSIS — I10 Essential (primary) hypertension: Secondary | ICD-10-CM | POA: Insufficient documentation

## 2020-01-18 DIAGNOSIS — Z79899 Other long term (current) drug therapy: Secondary | ICD-10-CM | POA: Diagnosis not present

## 2020-01-18 DIAGNOSIS — Z7982 Long term (current) use of aspirin: Secondary | ICD-10-CM | POA: Insufficient documentation

## 2020-01-18 DIAGNOSIS — Z87891 Personal history of nicotine dependence: Secondary | ICD-10-CM | POA: Insufficient documentation

## 2020-01-18 LAB — CBC WITH DIFFERENTIAL/PLATELET
Abs Immature Granulocytes: 0.03 10*3/uL (ref 0.00–0.07)
Basophils Absolute: 0.1 10*3/uL (ref 0.0–0.1)
Basophils Relative: 1 %
Eosinophils Absolute: 0.1 10*3/uL (ref 0.0–0.5)
Eosinophils Relative: 2 %
HCT: 35.6 % — ABNORMAL LOW (ref 36.0–46.0)
Hemoglobin: 11.1 g/dL — ABNORMAL LOW (ref 12.0–15.0)
Immature Granulocytes: 1 %
Lymphocytes Relative: 38 %
Lymphs Abs: 2.5 10*3/uL (ref 0.7–4.0)
MCH: 27.1 pg (ref 26.0–34.0)
MCHC: 31.2 g/dL (ref 30.0–36.0)
MCV: 86.8 fL (ref 80.0–100.0)
Monocytes Absolute: 0.4 10*3/uL (ref 0.1–1.0)
Monocytes Relative: 6 %
Neutro Abs: 3.5 10*3/uL (ref 1.7–7.7)
Neutrophils Relative %: 52 %
Platelets: 222 10*3/uL (ref 150–400)
RBC: 4.1 MIL/uL (ref 3.87–5.11)
RDW: 14.6 % (ref 11.5–15.5)
WBC: 6.6 10*3/uL (ref 4.0–10.5)
nRBC: 0 % (ref 0.0–0.2)

## 2020-01-18 LAB — COMPREHENSIVE METABOLIC PANEL
ALT: 15 U/L (ref 0–44)
AST: 16 U/L (ref 15–41)
Albumin: 3.5 g/dL (ref 3.5–5.0)
Alkaline Phosphatase: 75 U/L (ref 38–126)
Anion gap: 11 (ref 5–15)
BUN: 13 mg/dL (ref 8–23)
CO2: 26 mmol/L (ref 22–32)
Calcium: 9.1 mg/dL (ref 8.9–10.3)
Chloride: 102 mmol/L (ref 98–111)
Creatinine, Ser: 1.02 mg/dL — ABNORMAL HIGH (ref 0.44–1.00)
GFR calc Af Amer: >60 mL/min (ref >60)
GFR calc non Af Amer: 55 mL/min — ABNORMAL LOW (ref >60)
Glucose, Bld: 122 mg/dL — ABNORMAL HIGH (ref 70–99)
Potassium: 3.3 mmol/L — ABNORMAL LOW (ref 3.5–5.1)
Sodium: 139 mmol/L (ref 135–145)
Total Bilirubin: 0.4 mg/dL (ref 0.3–1.2)
Total Protein: 6.6 g/dL (ref 6.5–8.1)

## 2020-01-18 LAB — TYPE AND SCREEN
ABO/RH(D): B POS
Antibody Screen: NEGATIVE

## 2020-01-18 LAB — SAMPLE TO BLOOD BANK

## 2020-01-18 LAB — PROTIME-INR
INR: 1 (ref 0.8–1.2)
Prothrombin Time: 13 seconds (ref 11.4–15.2)

## 2020-01-18 LAB — POC OCCULT BLOOD, ED: Fecal Occult Bld: POSITIVE — AB

## 2020-01-18 LAB — POC SARS CORONAVIRUS 2 AG -  ED

## 2020-01-18 NOTE — ED Triage Notes (Signed)
Patient reports persistent bloody stools since Tuesday morning this week , denies abdominal pain , no emesis or fever .

## 2020-01-18 NOTE — ED Provider Notes (Signed)
[facility] EMERGENCY DEPARTMENT Provider Note   CSN: [id number] Arrival date & time: 01/18/20  0630     History Chief Complaint  Patient presents with  . Hematochezia    Brenda Rivera is a 69 y.o. female.  The history is provided by the patient and medical records. No language interpreter was used.     69 year old female with history of hypertension, GERD, hypercholesterolemia presenting for evaluation of rectal bleeding.  Patient was seen in the ED 2 days ago after she developed 2 episodes of bright red blood per rectum when she had her normal morning bowel movements.  She was seen in the ER at that time and her hemoglobin was 11.6.  She did not have any abdominal pain and no other concerning findings were noted.  She has not had a colonoscopy therefore she was recommended to follow-up with her PCP to schedule a colonoscopy for further evaluation of her GI bleeding.  She did attempt to reach out to her PCP office but have not heard anything back.  She still endorse noticing bright red blood mixed with darker blood per rectum after each bowel movement and whenever she wipes.  She endorsed strong foul odor with her flatus.  She does not complain of any fever chills no lightheadedness or dizziness no abdominal pain no rectal pain and she is not on any blood thinner medication.  She is here because her bleeding still persist.  No known history of cancer.  She has had prior hysterectomy.  Past Medical History:  Diagnosis Date  . GERD (gastroesophageal reflux disease)   . Hypercholesteremia   . Hypertension   . Hypothyroidism     Patient Active Problem List   Diagnosis Date Noted  . Ovarian cyst 05/26/2013  . Pelvic mass in female 03/21/2013    Past Surgical History:  Procedure Laterality Date  . ABDOMINAL HYSTERECTOMY  30 years ago   partial  . CARDIAC CATHETERIZATION    . LAPAROTOMY Bilateral 04/18/2013   Procedure: EXPLORATORY LAPAROTOMY BILATERAL SALPINGO  OOPHORECTOMY ;  Surgeon: Alvino Chapel, MD;  Location: [facility];  Service: Gynecology;  Laterality: Bilateral;  . THYROIDECTOMY  over 10  years ago     OB History   No obstetric history on file.     No family history on file.  Social History   Tobacco Use  . Smoking status: Former Smoker    Years: 20.00    Types: Cigarettes    Quit date: 03/01/1989    Years since quitting: 30.9  . Smokeless tobacco: Never Used  . Tobacco comment: pt unsure of exact quit date but it has been 22yrs since she quit.  Substance Use Topics  . Alcohol use: No  . Drug use: No    Home Medications Prior to Admission medications   Medication Sig Start Date End Date Taking? Authorizing Provider  aspirin EC 81 MG tablet Take 81 mg by mouth every morning.    [provider]  atorvastatin (LIPITOR) 20 MG tablet Take 20 mg by mouth daily.    [provider]  Cholecalciferol (VITAMIN D) 1000 UNITS capsule Take 1,000 Units by mouth daily.    [provider]  diltiazem (TAZTIA XT) 120 MG 24 hr capsule Take 120 mg by mouth daily.    [provider]  famotidine (PEPCID) 20 MG tablet Take 20 mg by mouth every morning.     [provider]  levothyroxine (SYNTHROID, LEVOTHROID) 50 MCG tablet Take 50  mcg by mouth daily before breakfast.    [provider]  oxyCODONE (OXY IR/ROXICODONE) 5 MG immediate release tablet Take 1 tablet (5 mg total) by mouth every 4 (four) hours as needed. Patient not taking: Reported on 01/16/2020 04/21/13   Joylene John D, NP    Allergies    Vibramycin [doxycycline]  Review of Systems   Review of Systems  All other systems reviewed and are negative.   Physical Exam Updated Vital Signs BP (!) 159/101 (BP Location: Right Arm)   Pulse (!) 110   Temp 97.9 F (36.6 C) (Oral)   Resp 16   Ht 5\' 4"  (1.626 m)   Wt 70 kg   SpO2 99%   BMI 26.49 kg/m   Physical Exam Vitals and nursing note reviewed.  Constitutional:       General: She is not in acute distress.    Appearance: She is well-developed.  HENT:     Head: Atraumatic.  Eyes:     Conjunctiva/sclera: Conjunctivae normal.  Cardiovascular:     Rate and Rhythm: Normal rate and regular rhythm.     Pulses: Normal pulses.  Pulmonary:     Effort: Pulmonary effort is normal.     Breath sounds: Normal breath sounds. No wheezing, rhonchi or rales.  Abdominal:     Palpations: Abdomen is soft.     Tenderness: There is no abdominal tenderness.  Genitourinary:    Comments: Chaperone present during exam.  Normal rectal tone, no obvious mass, no stool impaction, maroon color stool noted on glove.  Hemoccult positive. Musculoskeletal:     Cervical back: Neck supple.  Skin:    Findings: No rash.  Neurological:     Mental Status: She is alert and oriented to person, place, and time.  Psychiatric:        Mood and Affect: Mood normal.     ED Results / Procedures / Treatments   Labs (all labs ordered are listed, but only abnormal results are displayed) Labs Reviewed  CBC WITH DIFFERENTIAL/PLATELET - Abnormal; Notable for the following components:      Result Value   Hemoglobin 11.1 (*)    HCT 35.6 (*)    All other components within normal limits  COMPREHENSIVE METABOLIC PANEL - Abnormal; Notable for the following components:   Potassium 3.3 (*)    Glucose, Bld 122 (*)    Creatinine, Ser 1.02 (*)    GFR calc non Af Amer 55 (*)    All other components within normal limits  POC OCCULT BLOOD, ED - Abnormal; Notable for the following components:   Fecal Occult Bld POSITIVE (*)    All other components within normal limits  POC SARS CORONAVIRUS 2 AG -  ED - Abnormal; Notable for the following components:   SARS Coronavirus 2 Ag POSITIVE (*)    All other components within normal limits  PROTIME-INR  SAMPLE TO BLOOD BANK    EKG None  Radiology No results found.  Procedures Procedures (including critical care time)  Medications Ordered in  ED Medications - No data to display  ED Course  I have reviewed the triage vital signs and the nursing notes.  Pertinent labs & imaging results that were available during my care of the patient were reviewed by me and considered in my medical decision making (see chart for details).    MDM Rules/Calculators/A&P                      BP  135/89   Pulse 96   Temp 97.9 F (36.6 C) (Oral)   Resp 18   Ht 5\' 4"  (1.626 m)   Wt 70 kg   SpO2 100%   BMI 26.49 kg/m   Final Clinical Impression(s) / ED Diagnoses Final diagnoses:  Hematochezia    Rx / DC Orders ED Discharge Orders    None     12:00 PM Patient here with blood per rectum for the past 2 days.  No other complaint.  No abdominal pain or rectal pain.  Was seen 2 days prior in the ED and at that time her hemoglobin was 11.6.  On repeat today, hemoglobin is 11.1.  Mild hypokalemia with potassium of 3.3.  Otherwise patient does not have any reproducible abdominal tenderness to suggest the need for advanced imaging at this time.  I suspect this is likely a diverticular bleed.  No history of cancer.  She would benefit from a colonoscopy.  12:17 PM I did reach out to the  [facility] gastroenterology and was able to schedule a follow-up visit which will be on Wednesday, May 5 at 10 AM.  I discussed this with patient and she agrees with plan.  1:31 PM Please note a lab error was documented in patient's chart.  She did not have a Covid test nor is she Covid positive.  This is a lab error and I have requested for the staff to have it removed.  Patient did not receive any Covid testing. Pt understand to return promptly if she develop weakness, worsening bleeding, shortness of breath or any other concerning sxs.  Pt agrees.  Care discussed with Dr. Sherry Ruffing.    Domenic Moras, PA-C 01/18/20 1334    Tegeler, Gwenyth Allegra, MD 01/18/20 [phone]

## 2020-01-18 NOTE — ED Notes (Signed)
Patient verbalizes understanding of discharge instructions. Opportunity for questioning and answers were provided. Armband removed by staff, pt discharged from ED.  

## 2020-01-18 NOTE — Discharge Instructions (Signed)
You have been evaluated for your GI bleeding.  Your current hemoglobin is 11.1.  You have an appointment with GI specialist on May 5 at 10 AM.  Return promptly to the ED if you experience lightheadedness/dizziness, shortness of breath, worsening fatigue, or if you have any other concern.

## 2020-01-18 NOTE — ED Notes (Signed)
Walked patient to the bathroom patient did well 

## 2020-01-31 ENCOUNTER — Ambulatory Visit (INDEPENDENT_AMBULATORY_CARE_PROVIDER_SITE_OTHER): Payer: Medicare Other | Admitting: Nurse Practitioner

## 2020-01-31 ENCOUNTER — Encounter: Payer: Self-pay | Admitting: Nurse Practitioner

## 2020-01-31 VITALS — BP 138/82 | HR 90 | Temp 97.1°F | Ht 64.0 in | Wt 149.0 lb

## 2020-01-31 DIAGNOSIS — K219 Gastro-esophageal reflux disease without esophagitis: Secondary | ICD-10-CM | POA: Diagnosis not present

## 2020-01-31 DIAGNOSIS — K625 Hemorrhage of anus and rectum: Secondary | ICD-10-CM | POA: Diagnosis not present

## 2020-01-31 NOTE — Progress Notes (Signed)
ASSESSMENT / PLAN:   69 year old female with PMH significant for GERD, hypertension, hypothyroidism, thyroidectomy hyperlipidemia,  CKD?, history of pelvic seromucinous cystadenoma, status post bilateral salpingo-oophorectomy  # Recent painless rectal bleeding, resolved --Evaluated in ED 2 days apart in late April.  --Rule out perianal causes such as hemorrhoids. Rule out diverticular hemorrhage. Polyp / neoplasm not excluded --For further evaluation patient will be scheduled for colonoscopy. Patient will be scheduled for a colonoscopy. The risks and benefits of colonoscopy with possible polypectomy / biopsies were discussed and the patient agrees to proceed.   # Normocytic anemia.  --Hemoglobin 11.1 in ED 01/18/2020 --Baseline hemoglobin difficult to know.  She had not had any labs since 2014 at which time hemoglobin was 11.1  # Chronic GERD --Asymptomatic for years on famotidine   HPI:     Chief Complaint: Rectal bleeding   Brenda Rivera is a 69 year old female with PMH as listed above.  She here for evaluation of recent rectal bleeding   01/16/2020 - ED for evaluation of rectal bleeding. The bleeding started suddenly, she had no associated rectal or abdominal pain.  She was not constipated prior to the onset of bleeding.  She had two bowel movements with blood before going to the ED. In the ED was hemodynamically stable . Hemoglobin was 11.6. BUN 15., Cr 1.06.  EDP exam found bright red blood in the rectal vault, no masses or hemorrhoids were found.  No anal fissure seen.  Patient was discharged home from the ED  01/18/20 -returned to ED for ongoing rectal bleeding.  Still no abdominal pain or rectal pain associated with bleeding.  Hemodynamically stable .  Hgb 11.1,  BUN 26.  INR 1.0.  EDP's rectal exam showed no obvious masses, no stool impaction, maroon-colored stool on glove, Hemoccult positive.    Following the first ED visit the rectal bleeding started to improve but  because of some lingering bleeding she went back to the ED. after her second visit to the ED the bleeding continued to improve and within a couple days the bleeding had totally resolved.  She has not had any bleeding since.  She describes the blood as being dark red, not black.  She takes a daily baby aspirin.  No history of GI bleeding.  She has a history of very occasional constipation relieved with Dulcolax.   She has never had a colonoscopy.  No family history of colon cancer. She has a longstanding history of GERD,  asymptomatic on famotidine for years.   Data Reviewed:  01/17/20 ED -  Hemoglobin was 11.6, which is stable compared with her last CBC in 2014 at which time hemoglobin was 11.1.  FOBT positive.  Creatinine 1.02, liver studies normal.   Past Medical History:  Diagnosis Date  . GERD (gastroesophageal reflux disease)   . Hypercholesteremia   . Hypertension   . Hypothyroidism      Past Surgical History:  Procedure Laterality Date  . ABDOMINAL HYSTERECTOMY  30 years ago   partial  . CARDIAC CATHETERIZATION    . LAPAROTOMY Bilateral 04/18/2013   Procedure: EXPLORATORY LAPAROTOMY BILATERAL SALPINGO OOPHORECTOMY ;  Surgeon: Alvino Chapel, MD;  Location: [facility];  Service: Gynecology;  Laterality: Bilateral;  . THYROIDECTOMY  over 10  years ago   No family history on file. Social History   Tobacco Use  . Smoking status: Former Smoker    Years: 20.00    Types:  Cigarettes    Quit date: 03/01/1989    Years since quitting: 30.9  . Smokeless tobacco: Never Used  . Tobacco comment: pt unsure of exact quit date but it has been 25yrs since she quit.  Substance Use Topics  . Alcohol use: No  . Drug use: No   Current Outpatient Medications  Medication Sig Dispense Refill  . aspirin EC 81 MG tablet Take 81 mg by mouth every morning.    Marland Kitchen atorvastatin (LIPITOR) 20 MG tablet Take 20 mg by mouth daily.    . Cholecalciferol (VITAMIN D) 1000 UNITS capsule Take 1,000 Units by  mouth daily.    Marland Kitchen diltiazem (TAZTIA XT) 120 MG 24 hr capsule Take 120 mg by mouth daily.    . famotidine (PEPCID) 20 MG tablet Take 20 mg by mouth every morning.     Marland Kitchen levothyroxine (SYNTHROID, LEVOTHROID) 50 MCG tablet Take 50 mcg by mouth daily before breakfast.     No current facility-administered medications for this visit.   Allergies  Allergen Reactions  . Vibramycin [Doxycycline] Rash     Review of Systems: Positive for swelling of legs and feet.  All other systems reviewed and negative except where noted in HPI.   Serum creatinine: 1.02 mg/dL (H) 01/18/20 [id number] Estimated creatinine clearance: 47.8 mL/min (A)   Physical Exam:    Wt Readings from Last 3 Encounters:  01/31/20 149 lb (67.6 kg)  01/18/20 154 lb 5.2 oz (70 kg)  01/16/20 149 lb (67.6 kg)    BP 138/82 (BP Location: Left Arm, Patient Position: Sitting, Cuff Size: Normal)   Pulse 90   Temp (!) 97.1 F (36.2 C) (Oral) Comment (Src): thermoscan  Ht 5\' 4"  (1.626 m)   Wt 149 lb (67.6 kg)   BMI 25.58 kg/m  Constitutional:  Pleasant female in no acute distress. Psychiatric: Normal mood and affect. Behavior is normal. EENT: Pupils normal.  Conjunctivae are normal. No scleral icterus. Neck supple.  Cardiovascular: Normal rate, regular rhythm. No edema Pulmonary/chest: Effort normal and breath sounds normal. No wheezing, rales or rhonchi. Abdominal: Soft, nondistended, nontender. Bowel sounds active throughout. There are no masses palpable. No hepatomegaly. Neurological: Alert and oriented to person place and time. Skin: Skin is warm and dry. No rashes noted.  Tye Savoy, NP  01/31/2020, 10:24 AM

## 2020-01-31 NOTE — Patient Instructions (Signed)
If you are age 71 or older, your body mass index should be between 23-30. Your Body mass index is 25.58 kg/m. If this is out of the aforementioned range listed, please consider follow up with your Primary Care Provider.  If you are age 31 or younger, your body mass index should be between 19-25. Your Body mass index is 25.58 kg/m. If this is out of the aformentioned range listed, please consider follow up with your Primary Care Provider.   You have been scheduled for a colonoscopy. Please follow written instructions given to you at your visit today.   If you use inhalers (even only as needed), please bring them with you on the day of your procedure. Your physician has requested that you go to www.startemmi.com and enter the access code given to you at your visit today. This web site gives a general overview about your procedure. However, you should still follow specific instructions given to you by our office regarding your preparation for the procedure.  You have been given a Plenvu sample.  Thank you for choosing me and [facility].   Tye Savoy, NP

## 2020-02-02 NOTE — Progress Notes (Signed)
____________________________________________________________  Attending physician addendum:  Thank you for sending this case to me. I have reviewed the entire note, and the outlined plan seems appropriate.  Sounds like more bleeding than would be expected from hemorrhoids, especially when described as maroon by ED provider.  Agree with colonoscopy.  Wilfrid Lund, MD  ____________________________________________________________

## 2020-02-13 ENCOUNTER — Encounter: Payer: Self-pay | Admitting: Gastroenterology

## 2020-02-13 ENCOUNTER — Ambulatory Visit (AMBULATORY_SURGERY_CENTER): Payer: Medicare Other | Admitting: Gastroenterology

## 2020-02-13 ENCOUNTER — Other Ambulatory Visit: Payer: Self-pay

## 2020-02-13 VITALS — BP 128/73 | HR 65 | Temp 98.0°F | Resp 34 | Ht 64.0 in | Wt 149.0 lb

## 2020-02-13 DIAGNOSIS — D123 Benign neoplasm of transverse colon: Secondary | ICD-10-CM | POA: Diagnosis not present

## 2020-02-13 DIAGNOSIS — K625 Hemorrhage of anus and rectum: Secondary | ICD-10-CM

## 2020-02-13 DIAGNOSIS — K921 Melena: Secondary | ICD-10-CM | POA: Diagnosis present

## 2020-02-13 DIAGNOSIS — K5730 Diverticulosis of large intestine without perforation or abscess without bleeding: Secondary | ICD-10-CM

## 2020-02-13 MED ORDER — SODIUM CHLORIDE 0.9 % IV SOLN: 500.0000 mL | INTRAVENOUS | Status: DC

## 2020-02-13 NOTE — Progress Notes (Signed)
Temp JB V/s CW  

## 2020-02-13 NOTE — Op Note (Signed)
[facility] Patient Name: Brenda Rivera Procedure Date: 02/13/2020 1:34 PM MRN: [id number] Endoscopist: Mallie Mussel L. Loletha Carrow , MD Age: 69 Referring MD:  Date of Birth: 01-Apr-1951 Gender: Female Account #: [id number] Procedure:                Colonoscopy Indications:              Hematochezia (self-limited, prompted ED visits                            01/16/20 and 01/17/20) Medicines:                Monitored Anesthesia Care Procedure:                Pre-Anesthesia Assessment:                           - Prior to the procedure, a History and Physical                            was performed, and patient medications and                            allergies were reviewed. The patient's tolerance of                            previous anesthesia was also reviewed. The risks                            and benefits of the procedure and the sedation                            options and risks were discussed with the patient.                            All questions were answered, and informed consent                            was obtained. Prior Anticoagulants: The patient has                            taken no previous anticoagulant or antiplatelet                            agents except for aspirin. ASA Grade Assessment: II                            - A patient with mild systemic disease. After                            reviewing the risks and benefits, the patient was                            deemed in satisfactory condition to undergo the  procedure.                           After obtaining informed consent, the colonoscope                            was passed under direct vision. Throughout the                            procedure, the patient's blood pressure, pulse, and                            oxygen saturations were monitored continuously. The                            Colonoscope was introduced through the anus and   advanced to the the cecum, identified by                            appendiceal orifice and ileocecal valve. The                            colonoscopy was performed with difficulty due to                            significant looping and a tortuous colon. The                            patient tolerated the procedure well. The quality                            of the bowel preparation was good. The ileocecal                            valve, appendiceal orifice, and rectum were                            photographed. Scope In: 1:38:47 PM Scope Out: 2:03:35 PM Scope Withdrawal Time: 0 hours 15 minutes 29 seconds  Total Procedure Duration: 0 hours 24 minutes 48 seconds  Findings:                 The perianal and digital rectal examinations were                            normal.                           Many diverticula were found in the left colon.                           A diminutive polyp was found in the transverse                            colon. The polyp was sessile. The polyp was removed  with a cold snare. Resection and retrieval were                            complete.                           A 5 mm polyp was found in the transverse colon. The                            polyp was semi-pedunculated. The polyp was removed                            with a hot snare. Resection and retrieval were                            complete. (both polyps same pathology jar)                           The exam was otherwise without abnormality on                            direct and retroflexion views. Complications:            No immediate complications. Estimated Blood Loss:     Estimated blood loss was minimal. Impression:               - Diverticulosis in the left colon.                           - One diminutive polyp in the transverse colon,                            removed with a cold snare. Resected and retrieved.                           - One  5 mm polyp in the transverse colon, removed                            with a hot snare. Resected and retrieved.                           - The examination was otherwise normal on direct                            and retroflexion views.                           Patient appears to have had self-limited                            diverticular bleeding. Recommendation:           - Patient has a contact number available for                            emergencies. The  signs and symptoms of potential                            delayed complications were discussed with the                            patient. Return to normal activities tomorrow.                            Written discharge instructions were provided to the                            patient.                           - Resume previous diet.                           - Continue present medications.                           - Await pathology results.                           - Based on current guidelines, no repeat routine                            surveillance colonoscopy. Mykenna Viele L. Loletha Carrow, MD 02/13/2020 2:10:46 PM This report has been signed electronically.

## 2020-02-13 NOTE — Progress Notes (Signed)
A/ox3, pleased with MAC, report to RN 

## 2020-02-13 NOTE — Progress Notes (Signed)
Called to room to assist during endoscopic procedure.  Patient ID and intended procedure confirmed with present staff. Received instructions for my participation in the procedure from the performing physician.  

## 2020-02-13 NOTE — Patient Instructions (Signed)
YOU HAD AN ENDOSCOPIC PROCEDURE TODAY AT THE [facility]:   Refer to the procedure report that was given to you for any specific questions about what was found during the examination.  If the procedure report does not answer your questions, please call your gastroenterologist to clarify.  If you requested that your care partner not be given the details of your procedure findings, then the procedure report has been included in a sealed envelope for you to review at your convenience later.  YOU SHOULD EXPECT: Some feelings of bloating in the abdomen. Passage of more gas than usual.  Walking can help get rid of the air that was put into your GI tract during the procedure and reduce the bloating. If you had a lower endoscopy (such as a colonoscopy or flexible sigmoidoscopy) you may notice spotting of blood in your stool or on the toilet paper. If you underwent a bowel prep for your procedure, you may not have a normal bowel movement for a few days.  Please Note:  You might notice some irritation and congestion in your nose or some drainage.  This is from the oxygen used during your procedure.  There is no need for concern and it should clear up in a day or so.  SYMPTOMS TO REPORT IMMEDIATELY:   Following lower endoscopy (colonoscopy or flexible sigmoidoscopy):  Excessive amounts of blood in the stool  Significant tenderness or worsening of abdominal pains  Swelling of the abdomen that is new, acute  Fever of 100F or higher  For urgent or emergent issues, a gastroenterologist can be reached at any hour by calling [phone]. Do not use MyChart messaging for urgent concerns.    DIET:  We do recommend a small meal at first, but then you may proceed to your regular diet.  Drink plenty of fluids but you should avoid alcoholic beverages for 24 hours.  ACTIVITY:  You should plan to take it easy for the rest of today and you should NOT DRIVE or use heavy machinery until tomorrow (because  of the sedation medicines used during the test).    FOLLOW UP: Our staff will call the number listed on your records 48-72 hours following your procedure to check on you and address any questions or concerns that you may have regarding the information given to you following your procedure. If we do not reach you, we will leave a message.  We will attempt to reach you two times.  During this call, we will ask if you have developed any symptoms of COVID 19. If you develop any symptoms (ie: fever, flu-like symptoms, shortness of breath, cough etc.) before then, please call [phone].  If you test positive for Covid 19 in the 2 weeks post procedure, please call and report this information to us.    If any biopsies were taken you will be contacted by phone or by letter within the next 1-3 weeks.  Please call us at [phone] if you have not heard about the biopsies in 3 weeks.    SIGNATURES/CONFIDENTIALITY: You and/or your care partner have signed paperwork which will be entered into your electronic medical record.  These signatures attest to the fact that that the information above on your After Visit Summary has been reviewed and is understood.  Full responsibility of the confidentiality of this discharge information lies with you and/or your care-partner.  Await pathology  Please read over handouts about polyps and diverticulosis  Continue your normal medications 

## 2020-02-15 ENCOUNTER — Telehealth: Payer: Self-pay | Admitting: *Deleted

## 2020-02-15 NOTE — Telephone Encounter (Signed)
  Follow up Call-  Call back number 02/13/2020  Post procedure Call Back phone  # [phone]  Permission to leave phone message Yes  Some recent data might be hidden     Patient questions:  Message left to call us if necessary.

## 2020-02-15 NOTE — Telephone Encounter (Signed)
  Follow up Call-  Call back number 02/13/2020  Post procedure Call Back phone  # [phone]  Permission to leave phone message Yes  Some recent data might be hidden     Patient questions:  Message left to call us if necessary.  Second call.

## 2020-02-16 ENCOUNTER — Encounter: Payer: Self-pay | Admitting: Gastroenterology

## 2021-05-09 ENCOUNTER — Encounter (HOSPITAL_COMMUNITY): Payer: Self-pay | Admitting: Emergency Medicine

## 2021-05-09 ENCOUNTER — Other Ambulatory Visit: Payer: Self-pay

## 2021-05-09 ENCOUNTER — Ambulatory Visit (HOSPITAL_COMMUNITY)
Admission: EM | Admit: 2021-05-09 | Discharge: 2021-05-09 | Disposition: A | Discharge status: Home / Self Care | Payer: Medicare Other | Attending: Internal Medicine | Admitting: Internal Medicine

## 2021-05-09 DIAGNOSIS — Z20822 Contact with and (suspected) exposure to covid-19: Secondary | ICD-10-CM | POA: Insufficient documentation

## 2021-05-09 LAB — SARS CORONAVIRUS 2 (TAT 6-24 HRS): SARS Coronavirus 2: NEGATIVE

## 2021-05-09 NOTE — ED Triage Notes (Signed)
Husband tested positive to Covid. Patient having no symptoms, requesting covid test.

## 2023-10-14 ENCOUNTER — Other Ambulatory Visit: Payer: Self-pay | Admitting: Internal Medicine

## 2023-10-14 DIAGNOSIS — Z1231 Encounter for screening mammogram for malignant neoplasm of breast: Secondary | ICD-10-CM

## 2023-11-03 ENCOUNTER — Ambulatory Visit: Payer: Medicare Other

## 2023-11-08 ENCOUNTER — Other Ambulatory Visit: Payer: Self-pay | Admitting: Internal Medicine

## 2023-11-08 DIAGNOSIS — E2839 Other primary ovarian failure: Secondary | ICD-10-CM

## 2023-11-11 ENCOUNTER — Ambulatory Visit: Payer: Medicare Other

## 2023-11-18 ENCOUNTER — Ambulatory Visit: Payer: Medicare Other

## 2024-01-10 ENCOUNTER — Ambulatory Visit
Admission: RE | Admit: 2024-01-10 | Discharge: 2024-01-10 | Disposition: A | Discharge status: Home / Self Care | Payer: Medicare Other | Source: Ambulatory Visit | Attending: Internal Medicine | Admitting: Internal Medicine

## 2024-01-10 DIAGNOSIS — Z1231 Encounter for screening mammogram for malignant neoplasm of breast: Secondary | ICD-10-CM

## 2024-09-12 ENCOUNTER — Other Ambulatory Visit

## 2024-12-05 ENCOUNTER — Ambulatory Visit (HOSPITAL_BASED_OUTPATIENT_CLINIC_OR_DEPARTMENT_OTHER)
